# Patient Record
Sex: Male | Born: 1980 | Race: White | Hispanic: No | Marital: Single | Smoking: Current every day smoker
Health system: Southern US, Community
[De-identification: ages and names within clinical notes are randomized; demographics above are authoritative.]

## PROBLEM LIST (undated history)

## (undated) DIAGNOSIS — F329 Major depressive disorder, single episode, unspecified: Secondary | ICD-10-CM

## (undated) DIAGNOSIS — F99 Mental disorder, not otherwise specified: Secondary | ICD-10-CM

## (undated) DIAGNOSIS — Q688 Other specified congenital musculoskeletal deformities: Secondary | ICD-10-CM

## (undated) DIAGNOSIS — F419 Anxiety disorder, unspecified: Secondary | ICD-10-CM

## (undated) DIAGNOSIS — K219 Gastro-esophageal reflux disease without esophagitis: Secondary | ICD-10-CM

## (undated) DIAGNOSIS — G40409 Other generalized epilepsy and epileptic syndromes, not intractable, without status epilepticus: Secondary | ICD-10-CM

## (undated) DIAGNOSIS — J189 Pneumonia, unspecified organism: Secondary | ICD-10-CM

## (undated) DIAGNOSIS — F32A Depression, unspecified: Secondary | ICD-10-CM

## (undated) HISTORY — PX: OTHER SURGICAL HISTORY: SHX169

## (undated) HISTORY — PX: WISDOM TOOTH EXTRACTION: SHX21

---

## 2011-05-03 ENCOUNTER — Emergency Department (HOSPITAL_COMMUNITY)
Admission: EM | Admit: 2011-05-03 | Discharge: 2011-05-03 | Disposition: A | Discharge status: Home / Self Care | Payer: Self-pay | Attending: Emergency Medicine | Admitting: Emergency Medicine

## 2011-05-03 DIAGNOSIS — G40909 Epilepsy, unspecified, not intractable, without status epilepticus: Secondary | ICD-10-CM | POA: Insufficient documentation

## 2011-05-03 DIAGNOSIS — Z79899 Other long term (current) drug therapy: Secondary | ICD-10-CM | POA: Insufficient documentation

## 2011-05-03 DIAGNOSIS — Z760 Encounter for issue of repeat prescription: Secondary | ICD-10-CM | POA: Insufficient documentation

## 2011-05-03 DIAGNOSIS — Q899 Congenital malformation, unspecified: Secondary | ICD-10-CM | POA: Insufficient documentation

## 2011-06-23 ENCOUNTER — Emergency Department (HOSPITAL_COMMUNITY)
Admission: EM | Admit: 2011-06-23 | Discharge: 2011-06-23 | Disposition: A | Discharge status: Home / Self Care | Payer: Medicare Other | Attending: Emergency Medicine | Admitting: Emergency Medicine

## 2011-06-23 DIAGNOSIS — R569 Unspecified convulsions: Secondary | ICD-10-CM

## 2011-06-23 DIAGNOSIS — M6289 Other specified disorders of muscle: Secondary | ICD-10-CM | POA: Insufficient documentation

## 2011-06-23 DIAGNOSIS — Z993 Dependence on wheelchair: Secondary | ICD-10-CM | POA: Insufficient documentation

## 2011-06-23 DIAGNOSIS — G40909 Epilepsy, unspecified, not intractable, without status epilepticus: Secondary | ICD-10-CM | POA: Insufficient documentation

## 2011-06-23 HISTORY — DX: Other specified congenital musculoskeletal deformities: Q68.8

## 2011-06-23 LAB — VALPROIC ACID LEVEL: Valproic Acid Lvl: <10 ug/mL — ABNORMAL LOW (ref 50.0–100.0)

## 2011-06-23 MED ORDER — VALPROIC ACID 250 MG PO CAPS: ORAL_CAPSULE | ORAL | Status: DC

## 2011-06-23 MED ORDER — VALPROIC ACID 250 MG PO CAPS
250.0000 mg | ORAL_CAPSULE | Freq: Once | ORAL | Status: AC
  Administered 2011-06-23: 250 mg via ORAL
  Filled 2011-06-23: qty 1

## 2011-06-23 NOTE — ED Notes (Signed)
Pt went to [facility] for refill of valproic acid.  While there he had a witnessed gran mal seizure lasting approx 2 mins.  Pt was incontinent of urine.  Was post ictal per ems but now axox3 as has arrived.   Pt reports usually only has 1 seizure when he has them.

## 2011-06-23 NOTE — ED Notes (Signed)
Pt c/o tongue pain.  Did bite LT side of tongue during the seizure but no bleeding noted.  Pt also states his electric wheel chair remains at the [facility].  He took the bus to the urgent care.

## 2011-06-23 NOTE — ED Notes (Signed)
Helped pt get dressed and pt wanted me to tell the nurse that he needs to go straight home instead of getting wheelchair today.

## 2011-06-23 NOTE — ED Provider Notes (Signed)
History     CSN: [id number] Arrival date & time: 06/23/2011  2:13 PM   First MD Initiated Contact with Patient 06/23/11 1444    2:29PM  HPI Patient reports he's been out of valproic acid for one week. States he went to sleep tonight to urgent care to have a refill of his medication. While waiting, he had a grand mal seizure. Patient does not have a primary care physician or a neurologist in the [place] area. Would like a referral as well a prescription of valproic acid. Reports a history of seizure disorder. Denies any pain changes and typical seizure. Reports he did bite his tongue and is having tongue pain. Denies headache, nausea, dizziness, drowsiness, confusion. Patient is a 30 y.o. male presenting with seizures. The history is provided by the patient.  Seizures  This is a chronic problem. The current episode started 1 to 2 hours ago. The problem has been resolved. There was 1 seizure. The most recent episode lasted 30 to 120 seconds. Pertinent negatives include no sleepiness, no confusion, no headaches, no speech difficulty, no visual disturbance, no sore throat, no chest pain, no cough, no vomiting and no muscle weakness. Characteristics include rhythmic jerking, loss of consciousness and bit tongue. Characteristics do not include bladder incontinence. The episode was witnessed. There was no sensation of an aura present. The seizures did not continue in the ED. Possible causes include missed seizure meds. There has been no fever.    Past Medical History  Diagnosis Date  . Seizures   . Arthrogryposis     wheel chair bound    Past Surgical History  Procedure Date  . Knee tendonotomy     as a child    History reviewed. No pertinent family history.  History  Substance Use Topics  . Smoking status: Former Games developer  . Smokeless tobacco: Not on file  . Alcohol Use: Yes     occasionally      Review of Systems  Constitutional: Negative for fever and chills.  HENT: Negative  for sore throat, neck pain and neck stiffness.   Eyes: Negative for visual disturbance.  Respiratory: Negative for cough and shortness of breath.   Cardiovascular: Negative for chest pain.  Gastrointestinal: Negative for vomiting.  Genitourinary: Negative for bladder incontinence.  Musculoskeletal: Negative for back pain.  Neurological: Positive for seizures and loss of consciousness. Negative for speech difficulty, weakness and headaches.  Psychiatric/Behavioral: Negative for confusion.  All other systems reviewed and are negative.    Allergies  Amoxicillin and Morphine and related  Home Medications   Current Outpatient Rx  Name Route Sig Dispense Refill  . VALPROIC ACID 250 MG PO CAPS Oral Take 250-500 mg by mouth 2 (two) times daily. 1 capsule in the morning and 2 at night      BP 131/81  Pulse 78  Temp(Src) 97.6 F (36.4 C) (Oral)  Resp 16  Wt 106 lb (48.081 kg)  SpO2 99%  Physical Exam  Constitutional: He is oriented to person, place, and time. He appears well-developed and well-nourished.  HENT:  Head: Normocephalic and atraumatic.  Eyes: Conjunctivae are normal. Pupils are equal, round, and reactive to light.  Neck: Normal range of motion. Neck supple.  Cardiovascular: Normal rate, regular rhythm and normal heart sounds.   Pulmonary/Chest: Effort normal and breath sounds normal.  Abdominal: Soft. Bowel sounds are normal.  Neurological: He is alert and oriented to person, place, and time. He has normal strength. No cranial nerve deficit or sensory  deficit. Coordination normal. GCS eye subscore is 4. GCS verbal subscore is 5. GCS motor subscore is 6.       H/o Arthrogyposis: wheelchair bound  Skin: Skin is warm and dry. No rash noted. No erythema. No pallor.  Psychiatric: He has a normal mood and affect. His behavior is normal.    ED Course  Procedures   MDM   5:02 PM Valproic acid level is low. We'll write prescription for 30 days worth of valproic acid and  refer to neurology. Patient has not had seizure while in ED.      Thomasene Lot, [place] 06/23/11 [phone]

## 2011-06-23 NOTE — ED Notes (Signed)
Pt given discharge instructions and verbalizes understanding  

## 2011-06-23 NOTE — ED Notes (Signed)
ZOX:[id number]<AV> Expected date:06/23/11<BR> Expected time:<BR> Means of arrival:Ambulance<BR> Comments:<BR> M100 - seizure with hx.

## 2011-06-24 NOTE — ED Provider Notes (Signed)
Medical screening examination/treatment/procedure(s) were performed by non-physician practitioner and as supervising physician I was immediately available for consultation/collaboration.   Glynn Octave, MD 06/24/11 [phone]

## 2011-08-10 ENCOUNTER — Inpatient Hospital Stay (HOSPITAL_COMMUNITY)
Admission: EM | Admit: 2011-08-10 | Discharge: 2011-08-12 | DRG: 917 | Disposition: A | Discharge status: Home / Self Care | Payer: Medicare Other | Attending: Internal Medicine | Admitting: Internal Medicine

## 2011-08-10 ENCOUNTER — Encounter (HOSPITAL_COMMUNITY): Payer: Self-pay | Admitting: *Deleted

## 2011-08-10 ENCOUNTER — Emergency Department (HOSPITAL_COMMUNITY): Payer: Medicare Other

## 2011-08-10 ENCOUNTER — Other Ambulatory Visit: Payer: Self-pay

## 2011-08-10 DIAGNOSIS — F1210 Cannabis abuse, uncomplicated: Secondary | ICD-10-CM | POA: Diagnosis present

## 2011-08-10 DIAGNOSIS — F1910 Other psychoactive substance abuse, uncomplicated: Secondary | ICD-10-CM | POA: Diagnosis present

## 2011-08-10 DIAGNOSIS — E876 Hypokalemia: Secondary | ICD-10-CM | POA: Diagnosis present

## 2011-08-10 DIAGNOSIS — Y92009 Unspecified place in unspecified non-institutional (private) residence as the place of occurrence of the external cause: Secondary | ICD-10-CM

## 2011-08-10 DIAGNOSIS — D72829 Elevated white blood cell count, unspecified: Secondary | ICD-10-CM | POA: Diagnosis present

## 2011-08-10 DIAGNOSIS — G40909 Epilepsy, unspecified, not intractable, without status epilepticus: Secondary | ICD-10-CM | POA: Diagnosis present

## 2011-08-10 DIAGNOSIS — K922 Gastrointestinal hemorrhage, unspecified: Secondary | ICD-10-CM | POA: Diagnosis not present

## 2011-08-10 DIAGNOSIS — X58XXXA Exposure to other specified factors, initial encounter: Secondary | ICD-10-CM | POA: Diagnosis present

## 2011-08-10 DIAGNOSIS — T40904A Poisoning by unspecified psychodysleptics [hallucinogens], undetermined, initial encounter: Secondary | ICD-10-CM | POA: Diagnosis present

## 2011-08-10 DIAGNOSIS — G92 Toxic encephalopathy: Secondary | ICD-10-CM | POA: Diagnosis present

## 2011-08-10 DIAGNOSIS — G929 Unspecified toxic encephalopathy: Secondary | ICD-10-CM | POA: Diagnosis present

## 2011-08-10 DIAGNOSIS — T43502A Poisoning by unspecified antipsychotics and neuroleptics, intentional self-harm, initial encounter: Secondary | ICD-10-CM | POA: Diagnosis present

## 2011-08-10 DIAGNOSIS — T424X4A Poisoning by benzodiazepines, undetermined, initial encounter: Principal | ICD-10-CM | POA: Diagnosis present

## 2011-08-10 DIAGNOSIS — Z885 Allergy status to narcotic agent status: Secondary | ICD-10-CM

## 2011-08-10 DIAGNOSIS — J9600 Acute respiratory failure, unspecified whether with hypoxia or hypercapnia: Secondary | ICD-10-CM | POA: Diagnosis present

## 2011-08-10 DIAGNOSIS — I959 Hypotension, unspecified: Secondary | ICD-10-CM | POA: Diagnosis present

## 2011-08-10 DIAGNOSIS — Y929 Unspecified place or not applicable: Secondary | ICD-10-CM

## 2011-08-10 DIAGNOSIS — J9690 Respiratory failure, unspecified, unspecified whether with hypoxia or hypercapnia: Secondary | ICD-10-CM

## 2011-08-10 DIAGNOSIS — Z87891 Personal history of nicotine dependence: Secondary | ICD-10-CM

## 2011-08-10 DIAGNOSIS — Z993 Dependence on wheelchair: Secondary | ICD-10-CM

## 2011-08-10 DIAGNOSIS — T50901A Poisoning by unspecified drugs, medicaments and biological substances, accidental (unintentional), initial encounter: Secondary | ICD-10-CM

## 2011-08-10 DIAGNOSIS — I498 Other specified cardiac arrhythmias: Secondary | ICD-10-CM | POA: Diagnosis present

## 2011-08-10 DIAGNOSIS — Y999 Unspecified external cause status: Secondary | ICD-10-CM

## 2011-08-10 DIAGNOSIS — M6289 Other specified disorders of muscle: Secondary | ICD-10-CM | POA: Diagnosis present

## 2011-08-10 DIAGNOSIS — IMO0002 Reserved for concepts with insufficient information to code with codable children: Secondary | ICD-10-CM | POA: Diagnosis present

## 2011-08-10 LAB — RAPID URINE DRUG SCREEN, HOSP PERFORMED
Amphetamines: NOT DETECTED
Cocaine: NOT DETECTED
Opiates: POSITIVE — AB
Tetrahydrocannabinol: POSITIVE — AB

## 2011-08-10 LAB — COMPREHENSIVE METABOLIC PANEL
AST: 33 U/L (ref 0–37)
Albumin: 4.2 g/dL (ref 3.5–5.2)
Alkaline Phosphatase: 73 U/L (ref 39–117)
Chloride: 100 mEq/L (ref 96–112)
Potassium: 3 mEq/L — ABNORMAL LOW (ref 3.5–5.1)
Sodium: 138 mEq/L (ref 135–145)
Total Bilirubin: 0.3 mg/dL (ref 0.3–1.2)
Total Protein: 7.1 g/dL (ref 6.0–8.3)

## 2011-08-10 LAB — URINALYSIS, ROUTINE W REFLEX MICROSCOPIC
Glucose, UA: NEGATIVE mg/dL
Hgb urine dipstick: NEGATIVE
Leukocytes, UA: NEGATIVE
Protein, ur: NEGATIVE mg/dL
Specific Gravity, Urine: 1.026 (ref 1.005–1.030)
Urobilinogen, UA: 0.2 mg/dL (ref 0.0–1.0)

## 2011-08-10 LAB — BLOOD GAS, VENOUS
Acid-base deficit: 2.9 mmol/L — ABNORMAL HIGH (ref 0.0–2.0)
MECHVT: 310 mL
RATE: 15 resp/min
pCO2, Ven: 34.4 mmHg — ABNORMAL LOW (ref 45.0–50.0)
pH, Ven: 7.4 — ABNORMAL HIGH (ref 7.250–7.300)

## 2011-08-10 LAB — CBC
HCT: 43.5 % (ref 39.0–52.0)
HCT: 37.2 % — ABNORMAL LOW (ref 39.0–52.0)
Hemoglobin: 12.6 g/dL — ABNORMAL LOW (ref 13.0–17.0)
MCHC: 34.9 g/dL (ref 30.0–36.0)
Platelets: 267 10*3/uL (ref 150–400)
RBC: 3.81 MIL/uL — ABNORMAL LOW (ref 4.22–5.81)
RDW: 13 % (ref 11.5–15.5)
WBC: 13.3 10*3/uL — ABNORMAL HIGH (ref 4.0–10.5)
WBC: 18.6 10*3/uL — ABNORMAL HIGH (ref 4.0–10.5)

## 2011-08-10 LAB — BASIC METABOLIC PANEL
CO2: 16 mEq/L — ABNORMAL LOW (ref 19–32)
Calcium: 6.8 mg/dL — ABNORMAL LOW (ref 8.4–10.5)
GFR calc non Af Amer: >90 mL/min (ref >90)
Potassium: 4.3 mEq/L (ref 3.5–5.1)
Sodium: 140 mEq/L (ref 135–145)

## 2011-08-10 LAB — HEMOGLOBIN AND HEMATOCRIT, BLOOD: HCT: 37.6 % — ABNORMAL LOW (ref 39.0–52.0)

## 2011-08-10 LAB — CARDIAC PANEL(CRET KIN+CKTOT+MB+TROPI)
CK, MB: 7.9 ng/mL (ref 0.3–4.0)
Relative Index: 3.1 — ABNORMAL HIGH (ref 0.0–2.5)
Relative Index: 3.1 — ABNORMAL HIGH (ref 0.0–2.5)
Total CK: 257 U/L — ABNORMAL HIGH (ref 7–232)
Total CK: 345 U/L — ABNORMAL HIGH (ref 7–232)
Troponin I: <0.3 ng/mL (ref <0.30)

## 2011-08-10 LAB — GLUCOSE, CAPILLARY
Glucose-Capillary: 127 mg/dL — ABNORMAL HIGH (ref 70–99)
Glucose-Capillary: 138 mg/dL — ABNORMAL HIGH (ref 70–99)

## 2011-08-10 LAB — POCT I-STAT, CHEM 8
BUN: 10 mg/dL (ref 6–23)
Calcium, Ion: 1.1 mmol/L — ABNORMAL LOW (ref 1.12–1.32)
Chloride: 106 mEq/L (ref 96–112)
Glucose, Bld: 71 mg/dL (ref 70–99)

## 2011-08-10 LAB — PRO B NATRIURETIC PEPTIDE: Pro B Natriuretic peptide (BNP): 74.1 pg/mL (ref 0–125)

## 2011-08-10 LAB — LACTIC ACID, PLASMA: Lactic Acid, Venous: 0.4 mmol/L — ABNORMAL LOW (ref 0.5–2.2)

## 2011-08-10 LAB — PROCALCITONIN: Procalcitonin: 0.22 ng/mL

## 2011-08-10 LAB — MRSA PCR SCREENING: MRSA by PCR: NEGATIVE

## 2011-08-10 LAB — ACETAMINOPHEN LEVEL: Acetaminophen (Tylenol), Serum: <15 ug/mL (ref 10–30)

## 2011-08-10 MED ORDER — SUCCINYLCHOLINE CHLORIDE 20 MG/ML IJ SOLN
INTRAMUSCULAR | Status: AC | PRN
  Administered 2011-08-10: 100 mg via INTRAVENOUS

## 2011-08-10 MED ORDER — MIDAZOLAM HCL 5 MG/5ML IJ SOLN
INTRAMUSCULAR | Status: AC | PRN
  Administered 2011-08-10: 2 mg via INTRAVENOUS

## 2011-08-10 MED ORDER — CHLORHEXIDINE GLUCONATE 0.12 % MT SOLN
15.0000 mL | Freq: Two times a day (BID) | OROMUCOSAL | Status: DC
  Filled 2011-08-10 (×2): qty 15

## 2011-08-10 MED ORDER — DEXTROSE-NACL 5-0.45 % IV SOLN
INTRAVENOUS | Status: DC
  Administered 2011-08-10: 11:00:00 via INTRAVENOUS
  Administered 2011-08-11: 75 mL/h via INTRAVENOUS

## 2011-08-10 MED ORDER — NOREPINEPHRINE BITARTRATE 1 MG/ML IJ SOLN
2.0000 ug/min | INTRAVENOUS | Status: DC
  Administered 2011-08-10: 2 ug/min via INTRAVENOUS
  Filled 2011-08-10: qty 4

## 2011-08-10 MED ORDER — ETOMIDATE 2 MG/ML IV SOLN
INTRAVENOUS | Status: AC | PRN
  Administered 2011-08-10: 20 mg via INTRAVENOUS

## 2011-08-10 MED ORDER — DEXTROSE 50 % IV SOLN
25.0000 mL | Freq: Once | INTRAVENOUS | Status: AC
  Administered 2011-08-10: 25 mL via INTRAVENOUS

## 2011-08-10 MED ORDER — SODIUM CHLORIDE 0.9 % IV BOLUS (SEPSIS)
500.0000 mL | Freq: Once | INTRAVENOUS | Status: AC
  Administered 2011-08-10: 500 mL via INTRAVENOUS

## 2011-08-10 MED ORDER — ATROPINE SULFATE 1 MG/ML IJ SOLN
INTRAMUSCULAR | Status: AC | PRN
Start: 2011-08-10 — End: 2011-08-10
  Administered 2011-08-10: .5 mg via INTRAVENOUS

## 2011-08-10 MED ORDER — SODIUM CHLORIDE 0.9 % IV SOLN
INTRAVENOUS | Status: AC | PRN
  Administered 2011-08-10: 1000 mL/h via INTRAVENOUS

## 2011-08-10 MED ORDER — SODIUM CHLORIDE 0.9 % IV SOLN: INTRAVENOUS | Status: DC

## 2011-08-10 MED ORDER — VALPROIC ACID 250 MG/5ML PO SYRP
250.0000 mg | ORAL_SOLUTION | Freq: Every day | ORAL | Status: DC
  Filled 2011-08-10: qty 5

## 2011-08-10 MED ORDER — SODIUM CHLORIDE 0.9 % IV SOLN
8.0000 mg/h | INTRAVENOUS | Status: DC
  Administered 2011-08-10: 8 mg/h via INTRAVENOUS
  Filled 2011-08-10 (×2): qty 80

## 2011-08-10 MED ORDER — PROPOFOL 10 MG/ML IV EMUL
INTRAVENOUS | Status: AC
  Administered 2011-08-10: 20 ug/kg/min via INTRAVENOUS
  Filled 2011-08-10: qty 100

## 2011-08-10 MED ORDER — IOHEXOL 300 MG/ML  SOLN
100.0000 mL | Freq: Once | INTRAMUSCULAR | Status: AC | PRN
  Administered 2011-08-10: 100 mL via INTRAVENOUS

## 2011-08-10 MED ORDER — SODIUM CHLORIDE 0.9 % IV SOLN
750.0000 mL | INTRAVENOUS | Status: DC | PRN
  Administered 2011-08-10: 500 mL via INTRAVENOUS

## 2011-08-10 MED ORDER — POTASSIUM CHLORIDE 10 MEQ/100ML IV SOLN
10.0000 meq | INTRAVENOUS | Status: AC
  Administered 2011-08-10 (×4): 10 meq via INTRAVENOUS
  Filled 2011-08-10 (×4): qty 100

## 2011-08-10 MED ORDER — SODIUM CHLORIDE 0.9 % IV SOLN
1.0000 mg/h | INTRAVENOUS | Status: DC
  Administered 2011-08-10: 1 mg/h via INTRAVENOUS
  Filled 2011-08-10: qty 10

## 2011-08-10 MED ORDER — FENTANYL CITRATE 0.05 MG/ML IJ SOLN
INTRAMUSCULAR | Status: AC | PRN
  Administered 2011-08-10: 100 ug via INTRAVENOUS

## 2011-08-10 MED ORDER — SODIUM CHLORIDE 0.9 % IV SOLN
80.0000 mg | Freq: Once | INTRAVENOUS | Status: AC
  Administered 2011-08-10: 80 mg via INTRAVENOUS
  Filled 2011-08-10: qty 80

## 2011-08-10 MED ORDER — SODIUM CHLORIDE 0.9 % IV SOLN
10.0000 ug/h | INTRAVENOUS | Status: DC
  Administered 2011-08-10: 10 ug/h via INTRAVENOUS
  Filled 2011-08-10: qty 50

## 2011-08-10 MED ORDER — VALPROIC ACID 250 MG/5ML PO SYRP
500.0000 mg | ORAL_SOLUTION | Freq: Every day | ORAL | Status: DC
  Filled 2011-08-10 (×2): qty 10

## 2011-08-10 MED ORDER — VALPROIC ACID 250 MG/5ML PO SYRP
250.0000 mg | ORAL_SOLUTION | Freq: Two times a day (BID) | ORAL | Status: DC
  Administered 2011-08-10: 250 mg
  Filled 2011-08-10 (×2): qty 5

## 2011-08-10 MED ORDER — DEXTROSE 50 % IV SOLN
INTRAVENOUS | Status: AC
  Administered 2011-08-10: 25 mL via INTRAVENOUS
  Filled 2011-08-10: qty 50

## 2011-08-10 MED ORDER — MIDAZOLAM HCL 2 MG/2ML IJ SOLN
INTRAMUSCULAR | Status: AC
  Filled 2011-08-10: qty 2

## 2011-08-10 MED ORDER — VALPROIC ACID 250 MG/5ML PO SYRP
500.0000 mg | ORAL_SOLUTION | Freq: Every day | ORAL | Status: DC
  Administered 2011-08-10: 500 mg via ORAL
  Filled 2011-08-10 (×2): qty 10

## 2011-08-10 MED ORDER — PROPOFOL 10 MG/ML IV EMUL
5.0000 ug/kg/min | INTRAVENOUS | Status: DC
  Administered 2011-08-10: 20 ug/kg/min via INTRAVENOUS

## 2011-08-10 MED ORDER — POTASSIUM CHLORIDE 20 MEQ/15ML (10%) PO LIQD
40.0000 meq | ORAL | Status: AC
  Administered 2011-08-10 (×2): 40 meq
  Filled 2011-08-10 (×2): qty 30

## 2011-08-10 MED ORDER — CHLORHEXIDINE GLUCONATE 0.12 % MT SOLN
15.0000 mL | Freq: Two times a day (BID) | OROMUCOSAL | Status: DC
  Administered 2011-08-10: 15 mL via OROMUCOSAL
  Filled 2011-08-10: qty 15

## 2011-08-10 MED ORDER — PHENOL 1.4 % MT LIQD
2.0000 | OROMUCOSAL | Status: DC | PRN
  Administered 2011-08-10 – 2011-08-11 (×3): 2 via OROMUCOSAL
  Filled 2011-08-10: qty 177

## 2011-08-10 MED ORDER — VECURONIUM BROMIDE 10 MG IV SOLR: 6.0000 mg | Freq: Once | INTRAVENOUS | Status: AC

## 2011-08-10 MED ORDER — FENTANYL CITRATE 0.05 MG/ML IJ SOLN
INTRAMUSCULAR | Status: AC
  Filled 2011-08-10: qty 2

## 2011-08-10 MED ORDER — HEPARIN SODIUM (PORCINE) 5000 UNIT/ML IJ SOLN
5000.0000 [IU] | Freq: Three times a day (TID) | INTRAMUSCULAR | Status: DC
  Filled 2011-08-10 (×4): qty 1

## 2011-08-10 MED ORDER — PANTOPRAZOLE SODIUM 40 MG IV SOLR
40.0000 mg | Freq: Two times a day (BID) | INTRAVENOUS | Status: DC
  Administered 2011-08-10: 40 mg via INTRAVENOUS
  Filled 2011-08-10 (×3): qty 40

## 2011-08-10 MED ORDER — BIOTENE DRY MOUTH MT LIQD
1.0000 "application " | Freq: Four times a day (QID) | OROMUCOSAL | Status: DC
  Administered 2011-08-10 – 2011-08-12 (×6): 15 mL via OROMUCOSAL

## 2011-08-10 MED ORDER — VECURONIUM BROMIDE 10 MG IV SOLR
INTRAVENOUS | Status: AC | PRN
  Administered 2011-08-10: 6 mg via INTRAVENOUS

## 2011-08-10 MED FILL — Medication: Qty: 1 | Status: AC

## 2011-08-10 NOTE — H&P (Signed)
Name: [name] MRN: [id number] DOB: 1981-02-15    LOS: 0  PCCM ADMISSION NOTE  History of Present Illness: 31 yo brought to [facility] with slurred speech reporting smoking cannabis.  Became unresponsive and intubated for airway protection.  EMS did find a bottle of Clonazepam 1 mg with 60 tablets gone, prescribed yesterday.   Lines / Drains: 1/17  ETT 1/17  OGT 1/17  R IJ TLC  Cultures: None  Antibiotics: None  Tests / Events: 1/17  Head CT>>>  The patient is sedated, intubated and unable to provide history, which was obtained for available medical records.    Past Medical History  Diagnosis Date  . Seizures   . Arthrogryposis     wheel chair bound   Past Surgical History  Procedure Date  . Knee tendonotomy     as a child   Prior to Admission medications   Not on File   Allergies Allergies  Allergen Reactions  . Amoxicillin Other (See Comments)    Unsure of rxn  . Morphine And Related Hives   Family History History reviewed. No pertinent family history.  Social History  reports that he has quit smoking. He does not have any smokeless tobacco history on file. He reports that he drinks alcohol. He reports that he uses illicit drugs (Marijuana).  Review Of Systems  11 points review of systems is negative with an exception of listed in HPI.  Vital Signs: Temp:  [93.4 F (34.1 C)-95 F (35 C)] 95 F (35 C) (01/17 0200) Pulse Rate:  [50-103] 66  (01/17 0200) Resp:  [8-17] 17  (01/17 0200) BP: (102-151)/(53-105) 112/66 mmHg (01/17 0200) SpO2:  [100 %] 100 % (01/17 0200) FiO2 (%):  [100 %] 100 % (01/17 0125) Weight:  [47.628 kg (105 lb)] 47.628 kg (105 lb) (01/17 0020)    Physical Examination: General:  Mechanically ventilated, synchronous Neuro:  Sedated, arousable, nonfocal   HEENT:  PERRL, ETT / OGT in place, bloody gastric secretions Neck:  No  JVD Cardiovascular:  RRR, no murmurs Lungs:  CTAB, R lateral chest abrasion / ecchymosis  Abdomen:  Soft,  nontender, bowel sounds present Musculoskeletal:  Upper / lower extremities deformities Skin:  No rash  Ventilator settings: Vent Mode:  [-] PRVC FiO2 (%):  [100 %] 100 % Set Rate:  [15 bmp] 15 bmp Vt Set:  [310 mL] 310 mL PEEP:  [5 cmH20] 5 cmH20 Plateau Pressure:  [7 cmH20] 7 cmH20  Labs and Imaging:  Reviewed.  Please refer to the Assessment and Plan section for relevant results.  Assessment and Plan:  Encephalopathy secondary to polysubstance abuse.  Tox screen positive for benzodiazepines, opioids and cannabinoids. History of seizures, but no active seizures.  Sub therapeutic valproic acid level. Question noncompliance.  -->  Hold preadmission medications -->  Restart Depakote in AM -->  Propofol for synchrony -->  WUA -->  Head CT  Acute respiratory failure secondary to inability to protect airways. -->  full mechanical support -->  goal pH > 7.30, goal SpO2 > 92 -->  f/u ABG -->  f/u CXR -->  daily SBT  Acute upper gastrointestinal hemorrhage, etiology is not clear, hemodynamically stable. -->  Protonix gtt -->  NGT to Sx -->  H&H q6h -->  May need GI evaluation  Hypokalemia  Lab 08/10/11 0208 08/10/11 0140  K 3.2* 3.0*   -->  Replace  Best practices / Disposition -->  ICU status under PCCM -->  full code -->  SCDs  for DVT Px -->  GI Px is not indicated (on Protonix gtt) -->  ventilator bundle -->  NPO -->  family is not available   The patient is critically ill with multiple organ systems failure and requires high complexity decision making for assessment and support, frequent evaluation and titration of therapies, application of advanced monitoring technologies and extensive interpretation of multiple databases. Critical Care Time devoted to patient care services described in this note is 45 minutes.  Orlean Bradford, M.D. Pulmonary and Critical Care Medicine [facility] Cell: [phone] Pager: [phone]  08/10/2011, 2:36 AM

## 2011-08-10 NOTE — ED Notes (Signed)
WUJ:[id number]<JY> Expected date:<BR> Expected time:<BR> Means of arrival:<BR> Comments:<BR> EMS/Overdose/altered LOC

## 2011-08-10 NOTE — Progress Notes (Signed)
eLink Physician-Brief Progress Note Patient Name: Jamespaul Secrist DOB: 01-03-1981 MRN: [id number]  Date of Service  08/10/2011   HPI/Events of Note   Best practice  eICU Interventions  subq heparin for DVT propy   Intervention Category Intermediate Interventions: Best-practice therapies (e.g. DVT, beta blocker, etc.)  DETERDING,ELIZABETH 08/10/2011, 6:47 AM

## 2011-08-10 NOTE — ED Notes (Signed)
Per EMS - pt's neighbor called 911 concerned about pt. Pt had a bottle of klonopin filled yesterday w/ 60 tabs of 1mg . On arrival w/ unresponsive to painful stimuli - pupils reactive. Pt also reported to have smoked marijuana as well.

## 2011-08-10 NOTE — ED Notes (Signed)
Call placed to mom regarding pt's status. Mom did not answer. Left a message instruction mom to call this RN at her earliest convenience.

## 2011-08-10 NOTE — Progress Notes (Signed)
Name: [name] MRN: [id number] DOB: [birth year]    LOS: 0  PCCM progress note  History of Present Illness: 31 yo brought to [facility] with slurred speech reporting smoking cannabis.  Became unresponsive and intubated for airway protection.  EMS did find a bottle of Clonazepam 1 mg with 60 tablets gone, prescribed yesterday.   Lines / Drains: 1/17  ETT 1/17  OGT 1/17  R IJ TLC  Cultures: None  Antibiotics: None  Tests / Events: 1/17  Head CT and CT abd/pelvis: negative for acute findings.     Vital Signs: Temp:  [93 F (33.9 C)-96.4 F (35.8 C)] 95.5 F (35.3 C) (01/17 0745) Pulse Rate:  [50-103] 87  (01/17 0745) Resp:  [8-24] 18  (01/17 0745) BP: (78-151)/(32-105) 81/32 mmHg (01/17 0745) SpO2:  [100 %] 100 % (01/17 0745) FiO2 (%):  [50 %-100 %] 50 % (01/17 0730) Weight:  [47.628 kg (105 lb)-50 kg (110 lb 3.7 oz)] 50 kg (110 lb 3.7 oz) (01/17 0435)      . sodium chloride 1,000 mL/hr (08/10/11 0017)  . sodium chloride    . norepinephrine (LEVOPHED) Adult infusion    . propofol Stopped (08/10/11 0630)  . DISCONTD: fentaNYL infusion INTRAVENOUS Stopped (08/10/11 0230)  . DISCONTD: midazolam (VERSED) infusion Stopped (08/10/11 0230)  . DISCONTD: pantoprozole (PROTONIX) infusion 8 mg/hr (08/10/11 0349)    Intake/Output Summary (Last 24 hours) at 08/10/11 0904 Last data filed at 08/10/11 0700  Gross per 24 hour  Intake 512.15 ml  Output    700 ml  Net -187.85 ml    Physical Examination: General:  Mechanically ventilated, synchronous Neuro:  Sedated, arousable, nonfocal   HEENT:  PERRL, ETT / OGT in place, bloody gastric secretions Neck:  No  JVD Cardiovascular:  RRR, no murmurs Lungs:  CTAB, R lateral chest abrasion / ecchymosis  Abdomen:  Soft, nontender, bowel sounds present Musculoskeletal:  Upper / lower extremities deformities Skin:  No rash  Ventilator settings: Vent Mode:  [-] PRVC FiO2 (%):  [50 %-100 %] 50 % Set Rate:  [15 bmp] 15 bmp Vt Set:   [310 mL] 310 mL PEEP:  [5 cmH20] 5 cmH20 Plateau Pressure:  [7 cmH20-8 cmH20] 8 cmH20  Labs and Imaging:   No results found for this basename: CKTOTAL:3,CKMB:3,TROPONINI:3 in the last 168 hours No results found for this basename: PROBNP:3 in the last 168 hours   Lab 08/10/11 0208 08/10/11 0140  NA 142 138  K 3.2* 3.0*  CL 106 100  CO2 -- 25  BUN 10 10  CREATININE 0.60 0.40*  GLUCOSE 71 65*    Lab 08/10/11 0555 08/10/11 0208 08/10/11 0140  HGB 13.0 14.6 15.2  HCT 38.1* 43.0 43.5  WBC -- -- 13.3*  PLT -- -- 267   ABG    Component Value Date/Time   HCO3 21.5 08/10/2011 0225   TCO2 19.2 08/10/2011 0225   ACIDBASEDEF 2.9* 08/10/2011 0225   O2SAT 88.8 08/10/2011 0225   Pcxr: clear, no infiltrate, Support lines/tubes in good position.   Assessment and Plan:  Encephalopathy secondary to polysubstance abuse.  Tox screen positive for benzodiazepines, opioids and cannabinoids. History of seizures, but no active seizures.  Sub therapeutic valproic acid level. Question noncompliance. CT head unremarkable.  Plan: -->  cont Depakote (home med) -->  Propofol for synchrony PRN -->need psych and social work eval post-extubation -->  WUA  Hypotension/shock (unclear etiology). Suspect multi-factorial: residual sedation effect, hypovolemia, need to eval for hgb drift, trop I bump  and sepsis as well.   Lab 08/10/11 0200 08/10/11 0140  PROCALCITON -- --  WBC -- 13.3*  LATICACIDVEN 0.9 --  plan: -transduce CVP, check lactate, PCT, CEs, repeat stat Hgb (goal CVP 8-12) -add pressors for map >65  Acute respiratory failure secondary to inability to protect airways. -->  full mechanical support -->  goal pH > 7.30, goal SpO2 > 92 -->  f/u ABG -->  f/u CXR -->  daily SBT, after MS improved and hemodynamically stable.   Acute upper gastrointestinal hemorrhage, etiology is not clear. Suspect NGT trauma. Hgb trending down but no evidence of current bleeding and suspect hgb drift is d/t volume  resuscitation efforts.   Lab 08/10/11 0555 08/10/11 0208 08/10/11 0140  HGB 13.0 14.6 15.2  HCT 38.1* 43.0 43.5  plan: -->  Protonix q 12 -->  NGT clamp -->  H&H q6h -->  SCDs   Hypokalemia  Lab 08/10/11 0208 08/10/11 0140  K 3.2* 3.0*   -->  Replace & recheck  Best practices / Disposition -->  ICU status under PCCM -->  full code -->  SCDs for DVT Px -->  GI Px is not indicated (on Protonix gtt) -->  ventilator bundle -->  NPO -->  family is not available   Reviewed above, examined pt, and agree with assessment/plan.  Hemodynamics improved after volume resuscitation and successfully extubated.  Will need to have psych evaluate.  Critical care time 35 minutes.  Coralyn Helling, MD 08/10/2011, 3:42 PM Pager:  [phone]

## 2011-08-10 NOTE — ED Notes (Signed)
MD and resp at bedside

## 2011-08-10 NOTE — Progress Notes (Signed)
CARE MANAGEMENT NOTE 08/10/2011  Patient:  [name], [name]   Account Number:  [id number]  Date Initiated:  08/10/2011  Documentation initiated by:  DAVIS,RHONDA  Subjective/Objective Assessment:   pt with multi system problems , took overdaose of medications, and cannbis     Action/Plan:   lives at home   Anticipated DC Date:  08/13/2011   Anticipated DC Plan:  HOME/SELF CARE  In-house referral  NA      DC Planning Services  NA      [facility] Choice  NA   Choice offered to / List presented to:  NA      DME agency  NA     HH arranged  NA      HH agency  NA   Status of service:  In process, will continue to follow Medicare Important Message given?  NA - LOS <3 / Initial given by admissions (If response is "NO", the following Medicare IM given date fields will be blank) Date Medicare IM given:   Date Additional Medicare IM given:    Discharge Disposition:    Per UR Regulation:  Reviewed for med. necessity/level of care/duration of stay  Comments:  [id number]/Rhonda DAvis,Rn,BSn,CCM

## 2011-08-10 NOTE — Progress Notes (Signed)
CRITICAL VALUE ALERT  Critical value received:  MB 7.9 & Glucose 44  Date of notification:  08/10/2011   Time of notification:  1045  Critical value read back:yes  Nurse who received alert:  Chapman Fitch   MD notified (1st page):  Anders Simmonds, NP  Time of first page:  1048  MD notified (2nd page):  Time of second page:  Responding MD:  Anders Simmonds, NP  Time MD responded:  1048  New orders received

## 2011-08-10 NOTE — ED Notes (Signed)
Due to poor peripheral and femoral pulses and per Dr. Dierdre Highman, ABG is to be held at this time.

## 2011-08-10 NOTE — ED Provider Notes (Signed)
History     CSN: [id number]  Arrival date & time 08/10/11  0020   First MD Initiated Contact with Patient 08/10/11 0045      Chief Complaint  Patient presents with  . Drug Overdose    (Consider location/radiation/quality/duration/timing/severity/associated sxs/prior treatment) The history is provided by the EMS personnel.   level V caveat applies. Patient presents unresponsive GCS of 3. Per EMS, neighbor called 911 after patient was not acting normal. On EMS arrival patient reportedly had slurred speech and admitted to smoking marijuana and denied any other ingestions. Blood sugar within normal range and after a few minutes became completely unresponsive and was brought in by EMS. The medics did find a bottle of clonazepam 1 mg with 60 tablets gone, prescribed yesterday. Patient was also noted to have abrasion and ecchymosis to his right flank.  Past Medical History  Diagnosis Date  . Seizures   . Arthrogryposis     wheel chair bound    Past Surgical History  Procedure Date  . Knee tendonotomy     as a child    History reviewed. No pertinent family history.  History  Substance Use Topics  . Smoking status: Former Games developer  . Smokeless tobacco: Not on file  . Alcohol Use: Yes     occasionally      Review of Systems  Unable to perform ROS  level V caveat applies as above  Allergies  Amoxicillin and Morphine and related  Home Medications   Current Outpatient Rx  Name Route Sig Dispense Refill  . VALPROIC ACID 250 MG PO CAPS Oral Take 250-500 mg by mouth 2 (two) times daily. 1 capsule in the morning and 2 at night    . VALPROIC ACID 250 MG PO CAPS  1 Tab PO q morning, Then 2 tabs PO qhs 90 capsule 0    BP 102/53  Pulse 50  Temp(Src) 93.4 F (34.1 C) (Core (Comment))  Resp 8  Ht 4\' 9"  (1.448 m)  Wt 105 lb (47.628 kg)  BMI 22.72 kg/m2  SpO2 100%  Physical Exam  Constitutional: He appears well-developed and well-nourished.  HENT:  Head: Atraumatic.    Nose: Nose normal.       Oral pharynx is clear  Eyes: Pupils are equal, round, and reactive to light. Right eye exhibits no discharge. Left eye exhibits no discharge. No scleral icterus.  Neck: No tracheal deviation present.  Cardiovascular: Regular rhythm.        Week intact peripheral pulses bradycardic  Pulmonary/Chest: No stridor.       Decreased and shallow breath sounds bilaterally  Abdominal: Soft. He exhibits no distension.       Area of ecchymosis to right flank no crepitus  Genitourinary: Penis normal.  Musculoskeletal:       No spontaneous movements. Chronic deformities to all 4 extremities  Neurological:       GCS of 3. Pupils equal and reactive to light. No gag reflex. No spontaneous movement.  Skin: Skin is warm and dry.    ED Course  INTUBATION Date/Time: 08/10/2011 12:51 AM Performed by: Sunnie Nielsen Authorized by: Sunnie Nielsen Consent: The procedure was performed in an emergent situation. Required items: required blood products, implants, devices, and special equipment available Patient identity confirmed: arm band Time out: Immediately prior to procedure a "time out" was called to verify the correct patient, procedure, equipment, support staff and site/side marked as required. Indications: respiratory failure and airway protection Intubation method: direct Patient status: paralyzed (RSI)  Preoxygenation: nonrebreather mask and BVM Sedatives: etomidate Paralytic: succinylcholine Laryngoscope size: Mac 4 Tube size: 7.5 mm Tube type: cuffed Number of attempts: 1 Cricoid pressure: yes Cords visualized: yes Post-procedure assessment: chest rise and CO2 detector Breath sounds: equal Cuff inflated: yes ETT to teeth: 23 cm Tube secured with: ETT holder Chest x-ray interpreted by me. Comments: Huston Foley after intubation and HR 40s for 4-5 minutes and was given atropine 05.mg that improved HR  OG placement Date/Time: 08/10/2011 12:55 AM Performed by: Sunnie Nielsen Authorized by: Sunnie Nielsen Consent: The procedure was performed in an emergent situation. Patient identity confirmed: arm band Comments: Suction of gastric contents and appreciated gastric sounds after placement. CXR obtained   CRITICAL CARE Performed by: Sunnie Nielsen   Total critical care time: 70  Critical care time was exclusive of separately billable procedures and treating other patients.  Critical care was necessary to treat or prevent imminent or life-threatening deterioration.  Critical care was time spent personally by me on the following activities: development of treatment plan with patient and/or surrogate as well as nursing, discussions with consultants, evaluation of patient's response to treatment, examination of patient, obtaining history from patient or surrogate, ordering and performing treatments and interventions, ordering and review of laboratory studies, ordering and review of radiographic studies, pulse oximetry and re-evaluation of patient's condition. Vent management, sedation medications, critical care consultation for admit.    Results for orders placed during the hospital encounter of 08/10/11  CBC      Component Value Range   WBC 13.3 (*) 4.0 - 10.5 (K/uL)   RBC 4.54  4.22 - 5.81 (MIL/uL)   Hemoglobin 15.2  13.0 - 17.0 (g/dL)   HCT 69.6  29.5 - 28.4 (%)   MCV 95.8  78.0 - 100.0 (fL)   MCH 33.5  26.0 - 34.0 (pg)   MCHC 34.9  30.0 - 36.0 (g/dL)   RDW 13.2  44.0 - 10.2 (%)   Platelets 267  150 - 400 (K/uL)  COMPREHENSIVE METABOLIC PANEL      Component Value Range   Sodium 138  135 - 145 (mEq/L)   Potassium 3.0 (*) 3.5 - 5.1 (mEq/L)   Chloride 100  96 - 112 (mEq/L)   CO2 25  19 - 32 (mEq/L)   Glucose, Bld 65 (*) 70 - 99 (mg/dL)   BUN 10  6 - 23 (mg/dL)   Creatinine, Ser 7.25 (*) 0.50 - 1.35 (mg/dL)   Calcium 9.4  8.4 - 36.6 (mg/dL)   Total Protein 7.1  6.0 - 8.3 (g/dL)   Albumin 4.2  3.5 - 5.2 (g/dL)   AST 33  0 - 37 (U/L)   ALT 27  0 - 53  (U/L)   Alkaline Phosphatase 73  39 - 117 (U/L)   Total Bilirubin 0.3  0.3 - 1.2 (mg/dL)   GFR calc non Af Amer >90  >90 (mL/min)   GFR calc Af Amer >90  >90 (mL/min)  ACETAMINOPHEN LEVEL      Component Value Range   Acetaminophen (Tylenol), Serum <15.0  10 - 30 (ug/mL)  SALICYLATE LEVEL      Component Value Range   Salicylate Lvl <2.0 (*) 2.8 - 20.0 (mg/dL)  LACTIC ACID, PLASMA      Component Value Range   Lactic Acid, Venous 0.9  0.5 - 2.2 (mmol/L)  URINALYSIS, ROUTINE W REFLEX MICROSCOPIC      Component Value Range   Color, Urine YELLOW  YELLOW    APPearance CLEAR  CLEAR  Specific Gravity, Urine 1.026  1.005 - 1.030    pH 6.0  5.0 - 8.0    Glucose, UA NEGATIVE  NEGATIVE (mg/dL)   Hgb urine dipstick NEGATIVE  NEGATIVE    Bilirubin Urine NEGATIVE  NEGATIVE    Ketones, ur >80 (*) NEGATIVE (mg/dL)   Protein, ur NEGATIVE  NEGATIVE (mg/dL)   Urobilinogen, UA 0.2  0.0 - 1.0 (mg/dL)   Nitrite NEGATIVE  NEGATIVE    Leukocytes, UA NEGATIVE  NEGATIVE   URINE RAPID DRUG SCREEN (HOSP PERFORMED)      Component Value Range   Opiates POSITIVE (*) NONE DETECTED    Cocaine NONE DETECTED  NONE DETECTED    Benzodiazepines POSITIVE (*) NONE DETECTED    Amphetamines NONE DETECTED  NONE DETECTED    Tetrahydrocannabinol POSITIVE (*) NONE DETECTED    Barbiturates NONE DETECTED  NONE DETECTED   POCT I-STAT, CHEM 8      Component Value Range   Sodium 142  135 - 145 (mEq/L)   Potassium 3.2 (*) 3.5 - 5.1 (mEq/L)   Chloride 106  96 - 112 (mEq/L)   BUN 10  6 - 23 (mg/dL)   Creatinine, Ser 9.60  0.50 - 1.35 (mg/dL)   Glucose, Bld 71  70 - 99 (mg/dL)   Calcium, Ion 4.54 (*) 1.12 - 1.32 (mmol/L)   TCO2 23  0 - 100 (mmol/L)   Hemoglobin 14.6  13.0 - 17.0 (g/dL)   HCT 09.8  11.9 - 14.7 (%)   Dg Chest Portable 1 View  08/10/2011  *RADIOLOGY REPORT*  Clinical Data: Central line placement  PORTABLE CHEST - 1 VIEW  Comparison: 08/10/2011  Findings: Retraction of right IJ central line to the SVC RA  junction region.  Endotracheal tube and NG tube stable position. Normal heart size and vascularity.  Negative for focal airspace process or edema.  No effusion or pneumothorax.  IMPRESSION: Retracted right IJ central line now at the SVC/RA junction. Otherwise stable portable chest exam.  Original Report Authenticated By: Judie Petit. Ruel Favors, M.D.   Dg Chest Portable 1 View  08/10/2011  *RADIOLOGY REPORT*  Clinical Data: Endotracheal tube and central line placement; overdose, with altered mental status.  PORTABLE CHEST - 1 VIEW  Comparison: None.  Findings: The patient's endotracheal tube is seen ending 3-4 cm above the carina.  A right IJ line is noted extending through the right atrium and ending within the intrahepatic IVC.  This should be retracted at least 10-11 cm.  An enteric tube is noted ending at the fundus of the stomach.  Mild vascular congestion is noted; the lungs remain grossly clear. No pleural effusion or pneumothorax is seen.  The cardiomediastinal silhouette is borderline normal in size.  No acute osseous abnormalities are identified.  The stomach is partially filled with air.  IMPRESSION:  1.  Endotracheal tube seen ending 3-4 cm above the carina. 2.  Right IJ line noted ending within the intrahepatic IVC; this should be retracted at least 10-11 cm. 3.  Mild vascular congestion; lungs remain clear.  Findings were discussed with Dr. Sunnie Nielsen at 01:26 a.m. on 08/10/2011.  Original Report Authenticated By: Tonia Ghent, M.D.   Chest x-ray reviewed as above and central line retracted 10 cm. Repeat x-ray demonstrates lining in place.  Attempted to contact family multiple times.  Vital signs normalized after atropine given status post intubation.  MDM   Respiratory failure. Suspect overdose. Required intubation and ICU admit.        Arlys John  Dierdre Highman, MD 08/10/11 [phone]

## 2011-08-10 NOTE — ED Notes (Signed)
Critical Care Dr. Marton Redwood at bedside

## 2011-08-10 NOTE — Progress Notes (Signed)
Pt extubated to 2L [place] - no distress, no stridor, productive strong cough, positive bbs - RT will continue to monitor.  HR 100, Sat 100%, BP 135/81

## 2011-08-11 ENCOUNTER — Inpatient Hospital Stay (HOSPITAL_COMMUNITY): Payer: Medicare Other

## 2011-08-11 DIAGNOSIS — T40904A Poisoning by unspecified psychodysleptics [hallucinogens], undetermined, initial encounter: Secondary | ICD-10-CM

## 2011-08-11 DIAGNOSIS — Y92009 Unspecified place in unspecified non-institutional (private) residence as the place of occurrence of the external cause: Secondary | ICD-10-CM

## 2011-08-11 DIAGNOSIS — T424X4A Poisoning by benzodiazepines, undetermined, initial encounter: Principal | ICD-10-CM

## 2011-08-11 DIAGNOSIS — T438X2A Poisoning by other psychotropic drugs, intentional self-harm, initial encounter: Secondary | ICD-10-CM

## 2011-08-11 LAB — CBC
HCT: 34.7 % — ABNORMAL LOW (ref 39.0–52.0)
Hemoglobin: 12 g/dL — ABNORMAL LOW (ref 13.0–17.0)
MCH: 33 pg (ref 26.0–34.0)
MCHC: 34.6 g/dL (ref 30.0–36.0)
RDW: 13.1 % (ref 11.5–15.5)

## 2011-08-11 LAB — BASIC METABOLIC PANEL
BUN: <3 mg/dL — ABNORMAL LOW (ref 6–23)
Calcium: 8.3 mg/dL — ABNORMAL LOW (ref 8.4–10.5)
Creatinine, Ser: 0.3 mg/dL — ABNORMAL LOW (ref 0.50–1.35)
GFR calc non Af Amer: >90 mL/min (ref >90)
Glucose, Bld: 115 mg/dL — ABNORMAL HIGH (ref 70–99)

## 2011-08-11 LAB — GLUCOSE, CAPILLARY
Glucose-Capillary: 110 mg/dL — ABNORMAL HIGH (ref 70–99)
Glucose-Capillary: 115 mg/dL — ABNORMAL HIGH (ref 70–99)

## 2011-08-11 MED ORDER — DIVALPROEX SODIUM 500 MG PO DR TAB
500.0000 mg | DELAYED_RELEASE_TABLET | Freq: Every day | ORAL | Status: DC
  Administered 2011-08-11: 500 mg via ORAL
  Filled 2011-08-11 (×3): qty 1

## 2011-08-11 MED ORDER — ACETAMINOPHEN 325 MG PO TABS
650.0000 mg | ORAL_TABLET | Freq: Four times a day (QID) | ORAL | Status: DC | PRN
  Administered 2011-08-11: 650 mg via ORAL
  Filled 2011-08-11: qty 2

## 2011-08-11 MED ORDER — DIVALPROEX SODIUM 250 MG PO DR TAB
250.0000 mg | DELAYED_RELEASE_TABLET | Freq: Every day | ORAL | Status: DC
  Administered 2011-08-11 – 2011-08-12 (×2): 250 mg via ORAL
  Filled 2011-08-11 (×2): qty 1

## 2011-08-11 MED ORDER — SODIUM CHLORIDE 0.9 % IV SOLN
INTRAVENOUS | Status: DC
  Administered 2011-08-11: 12:00:00 via INTRAVENOUS
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
## 2011-08-11 NOTE — Progress Notes (Signed)
Name: [name] MRN: [id number] DOB: 03/13/1981    LOS: 1  PCCM progress note  History of Present Illness: 31 yo brought to [facility] with slurred speech reporting smoking cannabis.  Became unresponsive and intubated for airway protection.  EMS did find a bottle of Clonazepam 1 mg with 60 tablets gone, prescribed yesterday.   Lines / Drains: 1/17  ETT>>1/17 1/17  OGT>>1/17 1/17  R IJ TLC>>1/17  Cultures: None  Antibiotics: None  Tests / Events: 1/17  Head CT and CT abd/pelvis: negative for acute findings.   Subjective: C/o mild headache.  Denies chest/abd pain, or dyspnea.  Throat soreness improved.  Vital Signs: Temp:  [97.3 F (36.3 C)-100 F (37.8 C)] 97.8 F (36.6 C) (01/18 0800) Pulse Rate:  [67-105] 89  (01/18 0700) Resp:  [11-27] 18  (01/18 0700) BP: (89-144)/(36-81) 117/75 mmHg (01/18 0700) SpO2:  [95 %-100 %] 95 % (01/18 0700) FiO2 (%):  [39.7 %-40.4 %] 40 % (01/17 1400) Weight:  [113 lb 15.7 oz (51.7 kg)] 113 lb 15.7 oz (51.7 kg) (01/18 0345) CVP:  [7 mmHg-9 mmHg] 9 mmHg    . dextrose 5 % and 0.45% NaCl 75 mL/hr (08/11/11 0332)  . DISCONTD: sodium chloride    . DISCONTD: norepinephrine (LEVOPHED) Adult infusion Stopped (08/10/11 1152)  . DISCONTD: propofol Stopped (08/10/11 0630)    Intake/Output Summary (Last 24 hours) at 08/11/11 0920 Last data filed at 08/11/11 0600  Gross per 24 hour  Intake 2151.8 ml  Output   3245 ml  Net -1093.2 ml    Physical Examination: General:  No distress Neuro:  A&O x 3 HEENT:  PERRL Neck:  No  JVD Cardiovascular:  RRR, no murmurs Lungs:  CTAB, R lateral chest abrasion / ecchymosis  Abdomen:  Soft, nontender, bowel sounds present Musculoskeletal:  Upper / lower extremities deformities Skin:  No rash   Labs and Imaging:    Lab 08/10/11 1735 08/10/11 0957  CKTOTAL 345* 257*  CKMB 10.8* 7.9*  TROPONINI <0.30 <0.30    Lab 08/11/11 0310 08/10/11 0957  PROBNP 449.8* 74.1     Lab 08/11/11 0310 08/10/11 0957  08/10/11 0208 08/10/11 0140  NA 138 140 142 --  K 3.6 4.3 3.2* --  CL 108 112 106 --  CO2 21 16* -- 25  BUN <3* 10 10 --  CREATININE 0.30* 0.41* 0.60 --  GLUCOSE 115* 44* 71 --    Lab 08/11/11 0310 08/10/11 1735 08/10/11 1138 08/10/11 0957 08/10/11 0140  HGB 12.0* 12.8* 12.9* -- --  HCT 34.7* 37.6* 38.4* -- --  WBC 12.5* -- -- 18.6* 13.3*  PLT 199 -- -- 215 267   ABG    Component Value Date/Time   HCO3 21.5 08/10/2011 0225   TCO2 19.2 08/10/2011 0225   ACIDBASEDEF 2.9* 08/10/2011 0225   O2SAT 88.8 08/10/2011 0225   Ct Head Wo Contrast  08/10/2011  *RADIOLOGY REPORT*  Clinical Data: Overdose; unresponsive.  Patient intubated.  CT HEAD WITHOUT CONTRAST  Technique:  Contiguous axial images were obtained from the base of the skull through the vertex without contrast.  Comparison: None.  Findings: There is no evidence of acute infarction, mass lesion, or intra- or extra-axial hemorrhage on CT.  The posterior fossa, including the cerebellum, brainstem and fourth ventricle, is within normal limits.  The third and lateral ventricles, and basal ganglia are unremarkable in appearance.  The cerebral hemispheres are symmetric in appearance, with normal gray- white differentiation.  No mass effect or midline shift is  seen.  There is no evidence of fracture; visualized osseous structures are unremarkable in appearance.  The orbits are within normal limits. The paranasal sinuses and mastoid air cells are well-aerated.  No significant soft tissue abnormalities are seen.  IMPRESSION: Unremarkable noncontrast CT of the head.  Original Report Authenticated By: Tonia Ghent, M.D.   Ct Abdomen Pelvis W Contrast  08/10/2011  *RADIOLOGY REPORT*  Clinical Data: Overdose; unresponsive.  Right upper quadrant ecchymosis.  Leukocytosis.  CT ABDOMEN AND PELVIS WITH CONTRAST  Technique:  Multidetector CT imaging of the abdomen and pelvis was performed following the standard protocol during bolus administration of  intravenous contrast.  Contrast: OMNIPAQUE IOHEXOL 300 MG/ML IV SOLN  Comparison: None  Findings: Minimal bibasilar atelectasis is noted.  The liver and spleen are unremarkable in appearance.  The gallbladder is within normal limits.  The pancreas and adrenal glands are unremarkable.  There is incomplete rotation of both kidneys.  No perinephric stranding is seen.  No hydronephrosis is appreciated.  No renal or ureteral stones are identified.  The kidneys are otherwise unremarkable in appearance.  No free fluid is identified.  The small bowel is unremarkable in appearance.  The stomach is within normal limits; the patient's enteric tube is noted ending at the body of the stomach.  No acute vascular abnormalities are seen.  The appendix remains normal in caliber.  Trace fluid about the tip of the appendix reflects physiologic fluid, in the pelvic cul-de- sac.  The colon is largely decompressed and is unremarkable in appearance.  The sigmoid colon is mildly redundant.  The bladder is decompressed; bladder wall thickening may reflect chronic inflammation.  A Foley catheter is noted in expected position.  The prostate remains normal in size.  No inguinal lymphadenopathy is seen.  No acute osseous abnormalities are identified.  IMPRESSION:  1.  No acute abnormalities seen within the abdomen and pelvis. 2.  Bladder wall thickening may reflect chronic inflammation.  Original Report Authenticated By: Tonia Ghent, M.D.   Dg Chest Port 1 View  08/11/2011  *RADIOLOGY REPORT*  Clinical Data: Respiratory failure, follow-up  PORTABLE CHEST - 1 VIEW  Comparison: Portable chest x-ray of 08/10/2011  Findings: The endotracheal tube and NG tube have been removed as has the right IJ central venous line.  The lungs are not as well aerated and there is basilar atelectasis and perhaps mild pulmonary vascular congestion present.  The heart is within upper limits of normal.  The appearance of the right humerus humeral head is  unusual and could be positional, but dislocation cannot be excluded and clinical correlation is recommended.  IMPRESSION:  1.  Removal of endotracheal tube, NG tube, and central venous line. 2.  Diminished aeration with question of mild pulmonary vascular congestion. 3.  Cannot exclude posterior dislocation of the right shoulder. Correlate clinically.  Original Report Authenticated By: Juline Patch, M.D.   Dg Chest Portable 1 View  08/10/2011  *RADIOLOGY REPORT*  Clinical Data: Central line placement  PORTABLE CHEST - 1 VIEW  Comparison: 08/10/2011  Findings: Retraction of right IJ central line to the SVC RA junction region.  Endotracheal tube and NG tube stable position. Normal heart size and vascularity.  Negative for focal airspace process or edema.  No effusion or pneumothorax.  IMPRESSION: Retracted right IJ central line now at the SVC/RA junction. Otherwise stable portable chest exam.  Original Report Authenticated By: Judie Petit. Ruel Favors, M.D.   Dg Chest Portable 1 View  08/10/2011  *RADIOLOGY REPORT*  Clinical Data: Endotracheal tube and central line placement; overdose, with altered mental status.  PORTABLE CHEST - 1 VIEW  Comparison: None.  Findings: The patient's endotracheal tube is seen ending 3-4 cm above the carina.  A right IJ line is noted extending through the right atrium and ending within the intrahepatic IVC.  This should be retracted at least 10-11 cm.  An enteric tube is noted ending at the fundus of the stomach.  Mild vascular congestion is noted; the lungs remain grossly clear. No pleural effusion or pneumothorax is seen.  The cardiomediastinal silhouette is borderline normal in size.  No acute osseous abnormalities are identified.  The stomach is partially filled with air.  IMPRESSION:  1.  Endotracheal tube seen ending 3-4 cm above the carina. 2.  Right IJ line noted ending within the intrahepatic IVC; this should be retracted at least 10-11 cm. 3.  Mild vascular congestion; lungs  remain clear.  Findings were discussed with Dr. Sunnie Nielsen at 01:26 a.m. on 08/10/2011.  Original Report Authenticated By: Tonia Ghent, M.D.    Assessment and Plan:  Encephalopathy secondary to polysubstance abuse.  Tox screen positive for benzodiazepines, opioids and cannabinoids. History of seizures, but no active seizures.  Sub therapeutic valproic acid level. Question noncompliance. CT head unremarkable.  Plan: --> continue Depakote (home med) --> psych consult requested 1/17  Hypotension/shock (unclear etiology). Suspect multi-factorial: residual sedation effect, hypovolemia.  Resolved 1/18.  Lab 08/11/11 0310 08/10/11 1350 08/10/11 0957 08/10/11 0200 08/10/11 0140  PROCALCITON 0.30 0.22 -- -- --  WBC 12.5* -- 18.6* -- 13.3*  LATICACIDVEN -- -- 0.4* 0.9 --  plan: -continue IV fluids  Acute respiratory failure secondary to inability to protect airways. -->  Successfully extubated 1/17 -->  Bronchial hygiene  Acute upper gastrointestinal hemorrhage, etiology is not clear. Suspect NGT trauma.  No recurrence 1/18.  Lab 08/11/11 0310 08/10/11 1735 08/10/11 1138 08/10/11 0957  HGB 12.0* 12.8* 12.9* 12.6*  HCT 34.7* 37.6* 38.4* 37.2*  plan: -->  Change protonix to po daily -->  Advance diet -->  F/u CBC -->  SCDs   Hypokalemia  Lab 08/11/11 0310 08/10/11 0957 08/10/11 0208 08/10/11 0140  K 3.6 4.3 3.2* 3.0*   -->  Replace & recheck as needed  Best practices / Disposition -->  Transfer to medical floor non-tele bed -->  full code -->  SCDs for DVT Px  Will ask [facility] to assume care from 1/19 and PCCM sign off.  Coralyn Helling, MD 08/11/2011, 9:20 AM Pager:  [phone]
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
## 2011-08-12 LAB — BASIC METABOLIC PANEL
BUN: <3 mg/dL — ABNORMAL LOW (ref 6–23)
CO2: 25 mEq/L (ref 19–32)
GFR calc non Af Amer: >90 mL/min (ref >90)
Glucose, Bld: 93 mg/dL (ref 70–99)
Potassium: 3.3 mEq/L — ABNORMAL LOW (ref 3.5–5.1)

## 2011-08-12 LAB — CBC
Hemoglobin: 12.2 g/dL — ABNORMAL LOW (ref 13.0–17.0)
MCH: 34.2 pg — ABNORMAL HIGH (ref 26.0–34.0)
MCHC: 36.2 g/dL — ABNORMAL HIGH (ref 30.0–36.0)
MCV: 94.4 fL (ref 78.0–100.0)

## 2011-08-12 MED ORDER — POTASSIUM CHLORIDE CRYS ER 20 MEQ PO TBCR
40.0000 meq | EXTENDED_RELEASE_TABLET | Freq: Once | ORAL | Status: DC
  Filled 2011-08-12: qty 2

## 2011-08-12 MED ORDER — VALPROIC ACID 250 MG PO CAPS: 250.0000 mg | ORAL_CAPSULE | Freq: Two times a day (BID) | ORAL | Status: DC

## 2011-08-12 MED ORDER — CLONAZEPAM 1 MG PO TABS: 0.5000 mg | ORAL_TABLET | Freq: Two times a day (BID) | ORAL | Status: DC | PRN

## 2011-08-12 NOTE — Discharge Summary (Signed)
[name], 31 y.o., DOB Feb 19, 1981, MRN [id number]. Admission date: 08/10/2011 Discharge Date 08/12/2011 Primary MD No primary provider on file. Admitting Physician Lonia Farber, MD  Admission Diagnosis  Respiratory failure [518.81] Overdose [977.9] OVERDOSE  Discharge Diagnosis   - Intentional Drug Overdose      Past Medical History  Diagnosis Date  . Seizures   . Arthrogryposis     wheel chair bound    Past Surgical History  Procedure Date  . Knee tendonotomy     as a child     Hospital Course See H&P, Labs, Consult and Test reports for all details in brief, patient was admitted for  - intentional polypharmacy overdose causing decreased mental status requiring endotracheal intubation for airway protection, did by critical care team to ICU, was provided supportive care, he was initially hypotensive due to chemical effects off the medications marijuana, Klonopin.   Supportive care patient's mental status improved, extubated, then transferred to medical care unit, wrist Dr. Mickeal Skinner and cleared for home discharge,Ready been seen by the social worker and he will be seen by case manager and set up with outpatient PCP.  In addition today patient is alert awake in bed in good spirits, denies any suicidal or homicidal ideations, he has good range of motion in both upper extremities, above dictated x-ray with possible right shoulder dislocation which was done yesterday by critical care, does not correlate clinically, right shoulder tenderness,  good range of motion, is on palpation, no obvious deformity on examination of the shoulder. Has no cough no shortness of breath, no headache, possible mild basilar atelectasis he will take his incentive spirometer  Home which he is already using here, home today as he would like his parents to see his new apartment, denies any intentions of hurting himself he says that if he feels sad or depressed he will called the help line for  assistance.  The social worker to see him again and provide him with necessary documentation to seek help, and case manager to provide him with a PCP number. Reactionary leukocytosis is improving needs to be monitored outpatient patient is afebrile no signs of active infection.    Consults Psych  Significant Tests:  See full reports for all details     Ct Head Wo Contrast  08/10/2011  *RADIOLOGY REPORT*  Clinical Data: Overdose; unresponsive.  Patient intubated.  CT HEAD WITHOUT CONTRAST  Technique:  Contiguous axial images were obtained from the base of the skull through the vertex without contrast.  Comparison: None.  Findings: There is no evidence of acute infarction, mass lesion, or intra- or extra-axial hemorrhage on CT.  The posterior fossa, including the cerebellum, brainstem and fourth ventricle, is within normal limits.  The third and lateral ventricles, and basal ganglia are unremarkable in appearance.  The cerebral hemispheres are symmetric in appearance, with normal gray- white differentiation.  No mass effect or midline shift is seen.  There is no evidence of fracture; visualized osseous structures are unremarkable in appearance.  The orbits are within normal limits. The paranasal sinuses and mastoid air cells are well-aerated.  No significant soft tissue abnormalities are seen.  IMPRESSION: Unremarkable noncontrast CT of the head.  Original Report Authenticated By: Tonia Ghent, M.D.   Ct Abdomen Pelvis W Contrast  08/10/2011  *RADIOLOGY REPORT*  Clinical Data: Overdose; unresponsive.  Right upper quadrant ecchymosis.  Leukocytosis.  CT ABDOMEN AND PELVIS WITH CONTRAST  Technique:  Multidetector CT imaging of the abdomen and pelvis was performed following  the standard protocol during bolus administration of intravenous contrast.  Contrast: OMNIPAQUE IOHEXOL 300 MG/ML IV SOLN  Comparison: None  Findings: Minimal bibasilar atelectasis is noted.  The liver and spleen are unremarkable  in appearance.  The gallbladder is within normal limits.  The pancreas and adrenal glands are unremarkable.  There is incomplete rotation of both kidneys.  No perinephric stranding is seen.  No hydronephrosis is appreciated.  No renal or ureteral stones are identified.  The kidneys are otherwise unremarkable in appearance.  No free fluid is identified.  The small bowel is unremarkable in appearance.  The stomach is within normal limits; the patient's enteric tube is noted ending at the body of the stomach.  No acute vascular abnormalities are seen.  The appendix remains normal in caliber.  Trace fluid about the tip of the appendix reflects physiologic fluid, in the pelvic cul-de- sac.  The colon is largely decompressed and is unremarkable in appearance.  The sigmoid colon is mildly redundant.  The bladder is decompressed; bladder wall thickening may reflect chronic inflammation.  A Foley catheter is noted in expected position.  The prostate remains normal in size.  No inguinal lymphadenopathy is seen.  No acute osseous abnormalities are identified.  IMPRESSION:  1.  No acute abnormalities seen within the abdomen and pelvis. 2.  Bladder wall thickening may reflect chronic inflammation.  Original Report Authenticated By: Tonia Ghent, M.D.   Dg Chest Port 1 View  08/11/2011  *RADIOLOGY REPORT*  Clinical Data: Respiratory failure, follow-up  PORTABLE CHEST - 1 VIEW  Comparison: Portable chest x-ray of 08/10/2011  Findings: The endotracheal tube and NG tube have been removed as has the right IJ central venous line.  The lungs are not as well aerated and there is basilar atelectasis and perhaps mild pulmonary vascular congestion present.  The heart is within upper limits of normal.  The appearance of the right humerus humeral head is unusual and could be positional, but dislocation cannot be excluded and clinical correlation is recommended.  IMPRESSION:  1.  Removal of endotracheal tube, NG tube, and central venous  line. 2.  Diminished aeration with question of mild pulmonary vascular congestion. 3.  Cannot exclude posterior dislocation of the right shoulder. Correlate clinically.  Original Report Authenticated By: Juline Patch, M.D.   Dg Chest Portable 1 View  08/10/2011  *RADIOLOGY REPORT*  Clinical Data: Central line placement  PORTABLE CHEST - 1 VIEW  Comparison: 08/10/2011  Findings: Retraction of right IJ central line to the SVC RA junction region.  Endotracheal tube and NG tube stable position. Normal heart size and vascularity.  Negative for focal airspace process or edema.  No effusion or pneumothorax.  IMPRESSION: Retracted right IJ central line now at the SVC/RA junction. Otherwise stable portable chest exam.  Original Report Authenticated By: Judie Petit. Ruel Favors, M.D.   Dg Chest Portable 1 View  08/10/2011  *RADIOLOGY REPORT*  Clinical Data: Endotracheal tube and central line placement; overdose, with altered mental status.  PORTABLE CHEST - 1 VIEW  Comparison: None.  Findings: The patient's endotracheal tube is seen ending 3-4 cm above the carina.  A right IJ line is noted extending through the right atrium and ending within the intrahepatic IVC.  This should be retracted at least 10-11 cm.  An enteric tube is noted ending at the fundus of the stomach.  Mild vascular congestion is noted; the lungs remain grossly clear. No pleural effusion or pneumothorax is seen.  The cardiomediastinal silhouette is  borderline normal in size.  No acute osseous abnormalities are identified.  The stomach is partially filled with air.  IMPRESSION:  1.  Endotracheal tube seen ending 3-4 cm above the carina. 2.  Right IJ line noted ending within the intrahepatic IVC; this should be retracted at least 10-11 cm. 3.  Mild vascular congestion; lungs remain clear.  Findings were discussed with Dr. Sunnie Nielsen at 01:26 a.m. on 08/10/2011.  Original Report Authenticated By: Tonia Ghent, M.D.     Today   Subjective:   Earlin Sweeden today has no headache,no chest abdominal pain,no new weakness tingling or numbness, feels much better wants to go home today.    Objective:   Blood pressure 103/62, pulse 66, temperature 97.8 F (36.6 C), temperature source Oral, resp. rate 18, height 5' (1.524 m), weight 51 kg (112 lb 7 oz), SpO2 98.00%.  Intake/Output Summary (Last 24 hours) at 08/12/11 1247 Last data filed at 08/12/11 0900  Gross per 24 hour  Intake   1130 ml  Output   1500 ml  Net   -370 ml    Exam Awake Alert, Oriented *3, No new F.N deficits, Normal affect [place].AT,PERRAL Supple Neck,No JVD, No cervical lymphadenopathy appriciated.  Symmetrical Chest wall movement, Good air movement bilaterally, CTAB RRR,No Gallops,Rubs or new Murmurs, No Parasternal Heave +ve B.Sounds, Abd Soft, Non tender, No organomegaly appriciated, No rebound -guarding or rigidity. No Cyanosis, Clubbing or edema, No new Rash or bruise  Data Review      CBC w Diff: Lab Results  Component Value Date   WBC 12.1* 08/12/2011   HGB 12.2* 08/12/2011   HCT 33.7* 08/12/2011   PLT 190 08/12/2011   CMP: Lab Results  Component Value Date   NA 140 08/12/2011   K 3.3* 08/12/2011   CL 106 08/12/2011   CO2 25 08/12/2011   BUN <3* 08/12/2011   CREATININE 0.32* 08/12/2011   PROT 7.1 08/10/2011   ALBUMIN 4.2 08/10/2011   BILITOT 0.3 08/10/2011   ALKPHOS 73 08/10/2011   AST 33 08/10/2011   ALT 27 08/10/2011  .  Micro Results Recent Results (from the past 240 hour(s))  MRSA PCR SCREENING     Status: Normal   Collection Time   08/10/11  5:18 AM      Component Value Range Status Comment   MRSA by PCR NEGATIVE  NEGATIVE  Final      Discharge Instructions     Follow with Primary MD in 3-4 days   Get CBC, CMP, checked 3-4 days by Primary MD and again as instructed by your Primary MD. Get a 2 view Chest X ray done next visit if you had Pneumonia of Lung problems at the Hospital.  Get Medicines reviewed and adjusted.  Please request your  Prim.MD to go over all Hospital Tests and Procedure/Radiological results at the follow up, please get all Hospital records sent to your Prim MD by signing hospital release before you go home.  Follow-up Information    Follow up with [facility]. Call in 2 days.      Follow up with Mickeal Skinner, MD. Call in 2 days.   Contact information:   [address] [place] [address] [phone]          Activity: Fall precautions use walker/cane & assistance as needed  Diet: Heart Healthy, Aspiration precautions.  For Heart failure patients - Check your Weight same time everyday, if you gain over 2 pounds, or you develop  in leg swelling, experience more shortness of breath or chest pain, call your Primary MD immediately. Follow Cardiac Low Salt Diet and 1.8 lit/day fluid restriction.  Disposition Home  If you experience worsening of your admission symptoms, develop shortness of breath, life threatening emergency, suicidal or homicidal thoughts you must seek medical attention immediately by calling 911 or calling your MD immediately  if symptoms less severe.  You Must read complete instructions/literature along with all the possible adverse reactions/side effects for all the Medicines you take and that have been prescribed to you. Take any new Medicines after you have completely understood and accpet all the possible adverse reactions/side effects.   Do not drive if your were admitted for syncope or siezures until you have seen by Primary MD or a Neurologist and advised to drive.  Do not drive when taking Pain medications.    Do not take more than prescribed Pain, Sleep and Anxiety Medications  Special Instructions: If you have smoked or chewed Tobacco  in the last 2 yrs please stop smoking, stop any regular Alcohol  and or any Recreational drug use.  Wear Seat belts while driving.  Follow-up Information    Follow up with [facility]. Call in  2 days.      Follow up with Mickeal Skinner, MD. Call in 2 days.   Contact information:   [address] [place] [address] [phone]          Discharge Medications   Medication List  As of 08/12/2011 12:47 PM   CHANGE how you take these medications         clonazePAM 1 MG tablet   Commonly known as: KLONOPIN   Take 0.5 tablets (0.5 mg total) by mouth 2 (two) times daily as needed.   What changed: - dose - how often to take the med         CONTINUE taking these medications         valproic acid 250 MG capsule   Commonly known as: DEPAKENE   Take 1-2 capsules (250-500 mg total) by mouth 2 (two) times daily. 1 capsule in the morning and 2 at night          Where to get your medications    These are the prescriptions that you need to pick up.   You may get these medications from any pharmacy.         clonazePAM 1 MG tablet   valproic acid 250 MG capsule             Total Time in preparing paper work, data evaluation and todays exam - 35 minutes  Leroy Sea M.D on 08/12/2011 at 12:47 PM  [facility] Office  [phone]

## 2011-08-12 NOTE — Progress Notes (Signed)
Discharge teaching completed. Handouts given and reviewed on adult overdose, Md. Discharge teaching summary. Prescription given on Depakote and handout given. Pt. Left via wheel chair with family. No resp.distress noted. Pt. To follow up with primary care MD. Pt. Was seen by Social worker and Case Manager prior to discharge.

## 2011-08-13 NOTE — Progress Notes (Signed)
Provided Pt with outpt tx providers/information.  Answered questions about providers' proximity to Pt's home.  Unable to answer Pt's insurance questions, as the providers that are in close proximity to Pt were closed.  Pt to be d/c'd.  Providence Crosby, LCSWA Clinical Social Work [phone]

## 2011-08-14 LAB — GLUCOSE, CAPILLARY: Glucose-Capillary: 75 mg/dL (ref 70–99)

## 2011-12-04 ENCOUNTER — Emergency Department (HOSPITAL_COMMUNITY)
Admission: EM | Admit: 2011-12-04 | Discharge: 2011-12-04 | Discharge status: Against Medical Advice | Payer: Medicare Other | Attending: Emergency Medicine | Admitting: Emergency Medicine

## 2011-12-04 DIAGNOSIS — F489 Nonpsychotic mental disorder, unspecified: Secondary | ICD-10-CM | POA: Insufficient documentation

## 2011-12-04 NOTE — ED Notes (Signed)
Pt presented to the ER with police escort, pt state " I am here to talk to somebody about of my state of mind." Pt at this time denies any SI or HI ideations.

## 2011-12-04 NOTE — ED Notes (Signed)
Pt alert, sitting in the wheelchair, ACT team contacted for additional information for the resources. Pt at this time is not checking in.

## 2011-12-04 NOTE — ED Notes (Signed)
Pt asking about wait time, pt informed that at time not able to predict exact wait time, pt states he is not willing to wait, he wasn't to be see now, and that if he cannot be seen he will ho back home. [employer] at the triage window with pt, pt asking for ride back home. Waiting to see if pt is willing to stay or will decide to go home.

## 2012-01-05 ENCOUNTER — Encounter (HOSPITAL_COMMUNITY): Payer: Self-pay | Admitting: *Deleted

## 2012-01-05 ENCOUNTER — Emergency Department (HOSPITAL_COMMUNITY)
Admission: EM | Admit: 2012-01-05 | Discharge: 2012-01-05 | Disposition: A | Discharge status: Home / Self Care | Payer: Medicare Other | Attending: Emergency Medicine | Admitting: Emergency Medicine

## 2012-01-05 DIAGNOSIS — F1210 Cannabis abuse, uncomplicated: Secondary | ICD-10-CM

## 2012-01-05 DIAGNOSIS — M6289 Other specified disorders of muscle: Secondary | ICD-10-CM | POA: Insufficient documentation

## 2012-01-05 DIAGNOSIS — Z79899 Other long term (current) drug therapy: Secondary | ICD-10-CM | POA: Insufficient documentation

## 2012-01-05 DIAGNOSIS — G40909 Epilepsy, unspecified, not intractable, without status epilepticus: Secondary | ICD-10-CM | POA: Insufficient documentation

## 2012-01-05 DIAGNOSIS — F1010 Alcohol abuse, uncomplicated: Secondary | ICD-10-CM

## 2012-01-05 DIAGNOSIS — F10929 Alcohol use, unspecified with intoxication, unspecified: Secondary | ICD-10-CM

## 2012-01-05 DIAGNOSIS — R45851 Suicidal ideations: Secondary | ICD-10-CM | POA: Insufficient documentation

## 2012-01-05 HISTORY — DX: Major depressive disorder, single episode, unspecified: F32.9

## 2012-01-05 HISTORY — DX: Depression, unspecified: F32.A

## 2012-01-05 HISTORY — DX: Anxiety disorder, unspecified: F41.9

## 2012-01-05 HISTORY — DX: Mental disorder, not otherwise specified: F99

## 2012-01-05 LAB — COMPREHENSIVE METABOLIC PANEL
Albumin: 4 g/dL (ref 3.5–5.2)
Alkaline Phosphatase: 78 U/L (ref 39–117)
BUN: 5 mg/dL — ABNORMAL LOW (ref 6–23)
CO2: 21 mEq/L (ref 19–32)
Chloride: 102 mEq/L (ref 96–112)
Creatinine, Ser: 0.29 mg/dL — ABNORMAL LOW (ref 0.50–1.35)
GFR calc Af Amer: >90 mL/min (ref >90)
GFR calc non Af Amer: >90 mL/min (ref >90)
Glucose, Bld: 83 mg/dL (ref 70–99)
Total Bilirubin: 0.3 mg/dL (ref 0.3–1.2)

## 2012-01-05 LAB — RAPID URINE DRUG SCREEN, HOSP PERFORMED: Barbiturates: NOT DETECTED

## 2012-01-05 LAB — DIFFERENTIAL
Basophils Relative: 0 % (ref 0–1)
Eosinophils Relative: 2 % (ref 0–5)
Lymphocytes Relative: 57 % — ABNORMAL HIGH (ref 12–46)
Monocytes Relative: 6 % (ref 3–12)
Neutro Abs: 5 10*3/uL (ref 1.7–7.7)

## 2012-01-05 LAB — CBC
HCT: 45.1 % (ref 39.0–52.0)
Hemoglobin: 15.7 g/dL (ref 13.0–17.0)
MCV: 95.6 fL (ref 78.0–100.0)
RBC: 4.72 MIL/uL (ref 4.22–5.81)
WBC: 14.2 10*3/uL — ABNORMAL HIGH (ref 4.0–10.5)

## 2012-01-05 LAB — PATHOLOGIST SMEAR REVIEW

## 2012-01-05 LAB — ACETAMINOPHEN LEVEL: Acetaminophen (Tylenol), Serum: <15 ug/mL (ref 10–30)

## 2012-01-05 LAB — ETHANOL: Alcohol, Ethyl (B): 158 mg/dL — ABNORMAL HIGH (ref 0–11)

## 2012-01-05 MED ORDER — CLONAZEPAM 0.5 MG PO TABS
1.0000 mg | ORAL_TABLET | Freq: Once | ORAL | Status: AC
  Administered 2012-01-05: 1 mg via ORAL
  Filled 2012-01-05: qty 2

## 2012-01-05 MED ORDER — CLONAZEPAM 0.5 MG PO TABS: 0.5000 mg | ORAL_TABLET | Freq: Two times a day (BID) | ORAL | Status: DC

## 2012-01-05 MED ORDER — DIVALPROEX SODIUM 500 MG PO DR TAB
500.0000 mg | DELAYED_RELEASE_TABLET | Freq: Every day | ORAL | Status: DC
  Filled 2012-01-05: qty 1

## 2012-01-05 MED ORDER — DIVALPROEX SODIUM 250 MG PO DR TAB
250.0000 mg | DELAYED_RELEASE_TABLET | Freq: Every morning | ORAL | Status: DC
  Administered 2012-01-05: 250 mg via ORAL
  Filled 2012-01-05: qty 1

## 2012-01-05 MED ORDER — DIVALPROEX SODIUM 500 MG PO DR TAB: 500.0000 mg | DELAYED_RELEASE_TABLET | Freq: Once | ORAL | Status: DC

## 2012-01-05 MED ORDER — CLONAZEPAM 0.5 MG PO TABS
0.5000 mg | ORAL_TABLET | Freq: Two times a day (BID) | ORAL | Status: DC | PRN
  Administered 2012-01-05: 0.5 mg via ORAL
  Filled 2012-01-05: qty 1
  Filled 2012-01-05: qty 2

## 2012-01-05 NOTE — ED Notes (Addendum)
Pt agitated and uncooperative. Wanting to leave stating he is not suicidal. Explained to pt that he needs to stay to get treatment for his own safety. Pt refusing any medications. Dr. Brooke Dare notified and working on IVC paperwork.  ACT team notified. Sitter remains at bedside.

## 2012-01-05 NOTE — ED Notes (Signed)
Pt eating lunch, waiting for friend to come pick him up.

## 2012-01-05 NOTE — BH Assessment (Signed)
Assessment Note   [name] is an 31 y.o. male. Pt presents to [facility] with chief complaint  of alcohol intoxication and verbalized  suicidal threat while intoxicated. Pt presents angry and agitated.Pt reports smoking 1 blunt daily and reports last use was on 01-05-12(1)blunt. Pt reports occasional etoh use and appears to be minimizing etoh use,as pt was intoxicated when picked up by police. Pt reports that he does not remember anything that happened while he was intoxicated.Pt upset and fixated on his property manager fixing his fan and his stove. Pt requesting to be discharged home so he can handle personal issues with his Investment banker, corporate. Pt denies current SI,HI, and no AVH. Pt is able to contract for safety. Pt evaluated by Dr. Elsie Saas who recommended pt to be discharged. EDP Dr. Brooke Dare consulted and agreed to discharge pt home with referrals. Referrals and recommendations for follow-up care also provided by Ed staff and Act team.  Axis I: Alcohol Intoxication Axis II: Deferred Axis III:  Past Medical History  Diagnosis Date  . Seizures   . Arthrogryposis     wheel chair bound  . Mental disorder   . Depression   . Anxiety    Axis IV: housing problems, other psychosocial or environmental problems, problems related to social environment and problems with primary support group Axis V: 51-60 moderate symptoms  Past Medical History:  Past Medical History  Diagnosis Date  . Seizures   . Arthrogryposis     wheel chair bound  . Mental disorder   . Depression   . Anxiety     Past Surgical History  Procedure Date  . Knee tendonotomy     as a child    Family History: No family history on file.  Social History:  reports that he has been smoking Cigarettes.  He has never used smokeless tobacco. He reports that he drinks alcohol. He reports that he uses illicit drugs (Marijuana).  Additional Social History:  Alcohol / Drug Use Pain Medications:  (None Reported) Prescriptions:   (yes) Over the Counter:  (None) History of alcohol / drug use?: Yes Substance #1 Name of Substance 1:  (Cannibus) 1 - Age of First Use:  (20) 1 - Amount (size/oz):  (1 blunt ) 1 - Frequency:  (Daily) 1 - Duration:  (On-going use since age 77) 1 - Last Use / Amount:  (01-05-12;1 blunt)  CIWA: CIWA-Ar BP: 103/77 mmHg Pulse Rate: 91  COWS:    Allergies:  Allergies  Allergen Reactions  . Amoxicillin Other (See Comments)    Unsure of rxn  . Morphine And Related Hives    Home Medications:  (Not in a hospital admission)  OB/GYN Status:  No LMP for male patient.  General Assessment Data Location of Assessment: [facility] ED ACT Assessment: Yes Living Arrangements: Alone Can pt return to current living arrangement?: Yes Admission Status: Other (Comment) (IVC rescinded by Dr.Jonnalagadda) Transfer from: Other (Comment) Referral Source: Other Mudlogger)     Risk to self Suicidal Ideation: No (police report pt made statement while intoxicated) Suicidal Intent: No Is patient at risk for suicide?: No Suicidal Plan?: No Access to Means: No What has been your use of drugs/alcohol within the last 12 months?: Etoh and Marijuana Previous Attempts/Gestures: No (denies hx) How many times?: 0  Other Self Harm Risks: none reported Triggers for Past Attempts: None known Intentional Self Injurious Behavior: None Family Suicide History: No (pt reports that he is adopted, bio parents deceased) Recent stressful life event(s): Conflict (  Comment);Turmoil (Comment);Other (Comment) (issues w/landlord,no supports) Persecutory voices/beliefs?: No Depression: Yes Depression Symptoms: Tearfulness;Feeling worthless/self pity;Feeling angry/irritable Substance abuse history and/or treatment for substance abuse?: Yes (minimizing etoh use) Suicide prevention information given to non-admitted patients: Yes  Risk to Others Homicidal Ideation: No Thoughts of Harm to Others: No Current Homicidal  Intent: No Current Homicidal Plan: No Access to Homicidal Means: No Identified Victim: na History of harm to others?: No Assessment of Violence: None Noted Violent Behavior Description: Agitated,Irritated,Angry, Cooperating with (cooperating with assessor) Does patient have access to weapons?: No Criminal Charges Pending?: No Does patient have a court date: No  Psychosis Hallucinations: None noted Delusions: None noted  Mental Status Report Appear/Hygiene: Other (Comment) (Dressed in hospital scrubs) Eye Contact: Fair Motor Activity: Freedom of movement Speech: Pressured;Loud Level of Consciousness: Alert;Irritable Mood: Anxious;Angry Affect: Anxious;Appropriate to circumstance;Irritable Anxiety Level: Moderate Thought Processes: Coherent;Relevant;Circumstantial Judgement: Impaired Orientation: Person;Place;Time;Situation Obsessive Compulsive Thoughts/Behaviors: None  Cognitive Functioning Concentration: Decreased Memory: Recent Intact;Remote Intact IQ: Average Insight: Poor Impulse Control: Poor Appetite: Good Sleep: Decreased Total Hours of Sleep: 8  Vegetative Symptoms: None  ADLScreening [facility] Assessment Services) Patient's cognitive ability adequate to safely complete daily activities?: Yes Patient able to express need for assistance with ADLs?: Yes Independently performs ADLs?: Yes (does require wheelchair to ambulate)  Abuse/Neglect [facility]) Physical Abuse: Denies Verbal Abuse: Denies Sexual Abuse: Denies  Prior Inpatient Therapy Prior Inpatient Therapy: No Prior Therapy Dates: na Prior Therapy Facilty/Provider(s): na Reason for Treatment: na  Prior Outpatient Therapy Prior Outpatient Therapy: Yes Prior Therapy Dates: when i was younger no age or date specified Prior Therapy Facilty/Provider(s): Does not remember name Reason for Treatment: To address Adoption related issues  ADL Screening (condition at time of admission) Patient's cognitive ability  adequate to safely complete daily activities?: Yes Patient able to express need for assistance with ADLs?: Yes Independently performs ADLs?: Yes (does require wheelchair to ambulate) Weakness of Legs: None Weakness of Arms/Hands: None  Home Assistive Devices/Equipment Home Assistive Devices/Equipment: Wheelchair    Abuse/Neglect Assessment (Assessment to be complete while patient is alone) Physical Abuse: Denies Verbal Abuse: Denies Sexual Abuse: Denies Exploitation of patient/patient's resources: Denies Self-Neglect: Denies Values / Beliefs Cultural Requests During Hospitalization: None Spiritual Requests During Hospitalization: None   Advance Directives (For Healthcare) Advance Directive: Patient does not have advance directive Nutrition Screen Unintentional weight loss greater than 10lbs within the last month: No Problems chewing or swallowing foods and/or liquids: No Home Tube Feeding or Total Parenteral Nutrition (TPN): No  Additional Information 1:1 In Past 12 Months?: No CIRT Risk: No Elopement Risk: No Does patient have medical clearance?: No     Disposition:  Disposition Disposition of Patient: Other dispositions (Dr. Shela Commons, recommending dcd,EDP Discharged) Other disposition(s): Other (Comment) (Discharged home with outpt referrals)  On Site Evaluation by:   Reviewed with Physician:     Bjorn Pippin 01/05/2012 11:10 PM

## 2012-01-05 NOTE — ED Notes (Signed)
Pt brought in by [employer]; pt called [employer] stating he wanted to kill himself; per friend pt took more medicine than he was supposed to; friend at bedside states pt wanted to come to hospital to talk to someone.  On arrival pt argumentative, not cooperating, tearful.

## 2012-01-05 NOTE — ED Notes (Signed)
Pt getting agitated requesting Klonopin MD notified.

## 2012-01-05 NOTE — ED Notes (Signed)
[employer] and security at bedside 

## 2012-01-05 NOTE — ED Notes (Signed)
Unable to obtain all labs. Main lab notified and sending a phlebotomist to collect the rest.

## 2012-01-05 NOTE — ED Notes (Signed)
Pt placed in paper scrubs and socks with CN. All belongings placed in bag.

## 2012-01-05 NOTE — ED Notes (Signed)
Paperwork faxed over to [facility]. Notified of need for psych consult.

## 2012-01-05 NOTE — Discharge Instructions (Signed)
Depression, Adolescent and Adult Depression is a true and treatable medical condition. In general there are two kinds of depression:  Depression we all experience in some form. For example depression from the death of a loved one, financial distress or natural disasters will trigger or increase depression.   Clinical depression, on the other hand, appears without an apparent cause or reason. This depression is a disease. Depression may be caused by chemical imbalance in the body and brain or may come as a response to a physical illness. Alcohol and other drugs can cause depression.  DIAGNOSIS  The diagnosis of depression is usually based upon symptoms and medical history. TREATMENT  Treatments for depression fall into three categories. These are:  Drug therapy. There are many medicines that treat depression. Responses may vary and sometimes trial and error is necessary to determine the best medicines and dosage for a particular patient.   Psychotherapy, also called talking treatments, helps people resolve their problems by looking at them from a different point of view and by giving people insight into their own personal makeup. Traditional psychotherapy looks at a childhood source of a problem. Other psychotherapy will look at current conflicts and move toward solving those. If the cause of depression is drug use, counseling is available to help abstain. In time the depression will usually improve. If there were underlying causes for the chemical use, they can be addressed.   ECT (electroconvulsive therapy) or shock treatment is not as commonly used today. It is a very effective treatment for severe suicidal depression. During ECT electrical impulses are applied to the head. These impulses cause a generalized seizure. It can be effective but causes a loss of memory for recent events. Sometimes this loss of memory may include the last several months.  Treat all depression or suicide threats as  serious. Obtain professional help. Do not wait to see if serious depression will get better over time without help. Seek help for yourself or those around you. In the [place] the number to the National Suicide Help Lines With 24 Hour Help Are: 1-800-SUICIDE 5191157253 Document Released: 07/07/2000 Document Revised: 06/29/2011 Document Reviewed: 02/26/2008 [facility] Patient Information 2012 [place], [place].  RESOURCE GUIDE  Chronic Pain Problems: Contact [facility]  [phone] Patients need to be referred by their primary care doctor.  Insufficient Money for Medicine: Contact United Way:  call "211" or [facility] [phone].  No Primary Care Doctor: - Call Health Connect  [phone] - can help you locate a primary care doctor that  accepts your insurance, provides certain services, etc. - Physician Referral [phone]  Agencies that provide inexpensive medical care: - [facility]  [phone] - [facility]  [phone] - [facility]  [phone] [facility]  [phone] - Planned Parenthood  [phone] [facility]  [phone]  Medicaid-accepting [facility] Providers: - [facility]- [address], [address]  [phone], Mon-Fri 9am-7pm, Sat 9am-1pm - [facility]- [address] [place], Suite [place]  [phone] - [facility]- [address] [place]  [phone] [facility]- [address]  [phone] - Renaye Rakers- [address] [place], [address], [phone]  Only accepts [place] patients after they have their name  applied to their card  Self Pay (no insurance) in [place]: - Sickle Cell Patients: Dr Willey Blade, [facility]  [phone]  Vaughan Basta [place], [phone] - [facility]- [address] [place]  [phone]       Patrcia D[facility] [place]- [address]       -     [facility]- see information above (Speak to [employer] if you do not have insurance)       -  [facility]- [address] [place], [phone]       -  [facility] [place]- [address],  [phone]       -  [facility]- [address], [phone]       -  Dr Julio Sicks-  [address], [place], [phone]       -  [facility]- [address], [phone]       -  [facility]- [address], [phone], also [address], [phone]       -    [facility]- [address] [place], [phone], 1st & 3rd Saturday   every month, 10am-1pm  1) Find a Doctor and Pay Out of Pocket Although you won't have to find out who is covered by your insurance plan, it is a good idea to ask around and get recommendations. You will then need to call the office and see if the doctor you have chosen will accept you as a new patient and what types of options they offer for patients who are self-pay. Some doctors offer discounts or will set up payment plans for their patients who do not have insurance, but you will need to ask so you aren't surprised when you get to your appointment.  2) Contact Your Local Health Department Not all health departments have doctors that can see patients for sick visits, but many do, so it is worth a call to see if yours does. If you don't know where your local health department is, you can check in your phone book. The CDC also has a tool to help you locate your state's health department, and many state websites also have listings of all of their local health departments.  3) Find a Walk-in Clinic If your illness is not likely to be very severe or complicated, you may want to try a walk in clinic. These are popping up all over the country in pharmacies, drugstores, and shopping centers. They're usually staffed by nurse practitioners or physician assistants that have been trained to treat  common illnesses and complaints. They're usually fairly quick and inexpensive. However, if you have serious medical issues or chronic medical problems, these are probably not your best option  STD Testing - [facility] Department of [facility] [place], STD Clinic, [address] [place], phone [phone] or [phone].  Monday - Friday, call for an appointment. [facility] Department of [employer], STD Clinic, [address], [place], phone [phone] or [phone].  Monday - Friday, call for an appointment.  Abuse/Neglect: [facility] Child Abuse Hotline [phone] [facility] Child Abuse Hotline (281)565-0548 (After Hours)  Emergency Shelter:  [facility] 506-012-1296  Maternity Homes: - Room at the [place] [facility] [phone] - [facility] [phone]  MRSA Hotline #:   629 061 3319  [facility] Resources  [facility]  United Way [facility] Dept. [address]                 [address]         [address] [place] [address]  Blondell Reveal Phone:  [phone]                                  Phone:  [phone]                   Phone:  [phone]  [facility] Health, [phone] - [phone]       -     [facility] in [place], [address],                                  [phone], [facility] Child Abuse Hotline 430-778-9756 or (986) 427-0953 (After Hours)   Behavioral Health Services  Substance Abuse Resources: - [facility] Drug Services  [phone] - [facility] [phone] - [facility] [place] [phone] Floydene Flock [phone] - Residential & Outpatient Substance Abuse Program   [phone]  Psychological Services: [facility]  [phone] Services  [phone] - [facility], [phone] [place]. [address] [place], ACCESS LINE: [phone] or ([phone], [employer]  Dental Assistance  If unable to pay or uninsured, contact:  [facility] or [place] [facility]. to become qualified for the adult dental clinic.  Patients with Medicaid: [facility] [phone] [address], [phone] [address] [phone]  If unable to pay, or uninsured, contact [facility] [phone]) or [facility] Department [phone] in [place], [phone] in [facility]) to become qualified for the adult dental clinic  Other Low-Cost Community Dental Services: - [facility]- [address] [place], [place], [place], [address], [phone], [phone], 2nd and 4th Thursday of the month at 6:30am.  10 clients each day by appointment, can sometimes see walk-in patients if someone does not show for an appointment. [facility]- [address] Ether Griffins [place], [place], [address], [phone] - [facility]- [address] [place], [place], [address], [phone] - [facility]- [phone] [facility]- [phone] [facility] Department- [phone]

## 2012-01-05 NOTE — ED Notes (Signed)
Pt now agreeing to take medications at this time.

## 2012-01-05 NOTE — ED Notes (Signed)
Pt. and belongings wanded by security 

## 2012-01-05 NOTE — ED Provider Notes (Addendum)
Patient is resting comfortably. Remains stable overnight without any complaints. Awaiting formal disposition from psychiatry.  Labs reviewed.  Orders reviewed and appropriate   Dayton Bailiff, MD 01/05/12 [phone]  Patient became agitated and requesting to leave. Given the fact that he is not been evaluated at this time any had suicidal thoughts with a questionable attempt he will be IVC until further evaluated.  Dayton Bailiff, MD 01/05/12 1005  Patient has been evaluated by the psychiatry team. Evaluated by the psychiatrist who has performed rounds today. He feels the patient is safe for discharge to home. IVC paperwork has been rescinded. He will be discharged home  Dayton Bailiff, MD 01/05/12 1219

## 2012-01-05 NOTE — Consult Note (Signed)
Reason for Consult: Alcohol intoxication and marijuana abuse Referring Physician: Dr. Lavonda Jumbo is an 31 y.o. male.  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 Patient lives by himself in an apartment since October 2012 in [place], and receiving medication, Klonopin, and Depakote for anxiety and seizure disorder [facility]. He was relocated from [facility] and [place]. Patient was grew up with the attention deficit hyperactivity disorder, received stimulant medications and graduated from high school and also involved with the drinking and abusing marijuana, occasionally. Patient  was drunk last night and also smoking marijuana. Patient reported he does not sell drugs and don't do any hard-core drugs. Patient has no history of for behavioral health hospitalization since he was young. Patient denies current suicidal ideation, homicidal ideation, and psychosis. Patient was irritable, frustrated , for being in hospital and home and and he clear and fairly cooperative. He was found in his bed drinking the milk from [place] and asking to be released so that he can take care of his personal business of paying his bills and talking with his landlord / Production designer, theatre/television/film about fixing broken things in the apartment. He has no history of for suicidal attempt. Patient was not interested detox treatment and minimized his substance abuse.  Past Medical History  Diagnosis Date  . Seizures   . Arthrogryposis     wheel chair bound    Past Surgical History  Procedure Date  . Knee tendonotomy     as a child    No family history on file.  Social History:  reports that he has quit smoking. His smoking use included Cigarettes. He has never used smokeless tobacco. He reports that he drinks alcohol. He reports that he uses illicit drugs  (Marijuana).  Allergies:  Allergies  Allergen Reactions  . Amoxicillin Other (See Comments)    Unsure of rxn  . Morphine And Related Hives    Medications: I have reviewed the patient's current medications.  Results for orders placed during the hospital encounter of 01/05/12 (from the past 48 hour(s))  URINE RAPID DRUG SCREEN (HOSP PERFORMED)     Status: Abnormal   Collection Time   01/05/12  2:49 AM      Component Value Range Comment   Opiates NONE DETECTED  NONE DETECTED    Cocaine NONE DETECTED  NONE DETECTED    Benzodiazepines NONE DETECTED  NONE DETECTED    Amphetamines NONE DETECTED  NONE DETECTED    Tetrahydrocannabinol POSITIVE (*) NONE DETECTED    Barbiturates NONE DETECTED  NONE DETECTED   COMPREHENSIVE METABOLIC PANEL     Status: Abnormal   Collection Time   01/05/12  3:00 AM      Component Value Range Comment   Sodium 138  135 - 145 mEq/L    Potassium 5.1  3.5 - 5.1 mEq/L MODERATE HEMOLYSIS   Chloride 102  96 - 112 mEq/L    CO2 21  19 - 32 mEq/L    Glucose, Bld 83  70 - 99 mg/dL    BUN 5 (*) 6 - 23 mg/dL    Creatinine, Ser 1.61 (*) 0.50 - 1.35 mg/dL    Calcium 9.3  8.4 - 09.6 mg/dL    Total Protein 7.5  6.0 - 8.3 g/dL    Albumin 4.0  3.5 - 5.2 g/dL    AST 50 (*)  0 - 37 U/L MODERATE HEMOLYSIS   ALT 23  0 - 53 U/L MODERATE HEMOLYSIS   Alkaline Phosphatase 78  39 - 117 U/L MODERATE HEMOLYSIS   Total Bilirubin 0.3  0.3 - 1.2 mg/dL    GFR calc non Af Amer >90  >90 mL/min    GFR calc Af Amer >90  >90 mL/min   ETHANOL     Status: Abnormal   Collection Time   01/05/12  3:00 AM      Component Value Range Comment   Alcohol, Ethyl (B) 158 (*) 0 - 11 mg/dL   ACETAMINOPHEN LEVEL     Status: Normal   Collection Time   01/05/12  3:00 AM      Component Value Range Comment   Acetaminophen (Tylenol), Serum <15.0  10 - 30 ug/mL   VALPROIC ACID LEVEL     Status: Abnormal   Collection Time   01/05/12  3:00 AM      Component Value Range Comment   Valproic Acid Lvl 20.7 (*)  50.0 - 100.0 ug/mL   CBC     Status: Abnormal   Collection Time   01/05/12  3:33 AM      Component Value Range Comment   WBC 14.2 (*) 4.0 - 10.5 K/uL    RBC 4.72  4.22 - 5.81 MIL/uL    Hemoglobin 15.7  13.0 - 17.0 g/dL    HCT 16.1  09.6 - 04.5 %    MCV 95.6  78.0 - 100.0 fL    MCH 33.3  26.0 - 34.0 pg    MCHC 34.8  30.0 - 36.0 g/dL    RDW 40.9  81.1 - 91.4 %    Platelets 263  150 - 400 K/uL   SALICYLATE LEVEL     Status: Abnormal   Collection Time   01/05/12  3:33 AM      Component Value Range Comment   Salicylate Lvl <2.0 (*) 2.8 - 20.0 mg/dL REPEATED TO VERIFY  DIFFERENTIAL     Status: Abnormal   Collection Time   01/05/12  3:33 AM      Component Value Range Comment   Neutrophils Relative 35 (*) 43 - 77 %    Lymphocytes Relative 57 (*) 12 - 46 %    Monocytes Relative 6  3 - 12 %    Eosinophils Relative 2  0 - 5 %    Basophils Relative 0  0 - 1 %    Neutro Abs 5.0  1.7 - 7.7 K/uL    Lymphs Abs 8.0 (*) 0.7 - 4.0 K/uL    Monocytes Absolute 0.9  0.1 - 1.0 K/uL    Eosinophils Absolute 0.3  0.0 - 0.7 K/uL    Basophils Absolute 0.0  0.0 - 0.1 K/uL    WBC Morphology ABSOLUTE LYMPHOCYTOSIS   ATYPICAL LYMPHOCYTES    No results found.  Positive for anxiety, bad mood, excessive alcohol consumption and illegal drug usage Blood pressure 111/62, pulse 92, temperature 97.3 F (36.3 C), resp. rate 20, SpO2 99.00%.   Assessment/Plan: Alcohol abuse versus dependence and intoxication with a blood alcohol level 158. Marijuana abuse, tox screen for marijuana  Recommended outpatient psychiatric and substance abuse treatment. Referred for case management services. Patient does not meet criteria for acute psychiatric hospitalization.   Maurice Ramseur,JANARDHAHA R. 01/05/2012, 11:35 AM

## 2012-01-05 NOTE — ED Notes (Signed)
Dr. Shela Commons, psychiatrist, at bedside.

## 2012-01-05 NOTE — ED Notes (Addendum)
Pt belongings behind nurses station directly in front of pts room.  Security called to wand patient.

## 2012-01-05 NOTE — ED Provider Notes (Signed)
History     CSN: [id number]  Arrival date & time 01/05/12  0157   First MD Initiated Contact with Patient 01/05/12 0402      Chief Complaint  Patient presents with  . Suicidal    (Consider location/radiation/quality/duration/timing/severity/associated sxs/prior treatment) HPI Level 5 Caveat: altered mental status. This is a 31 year old male who was brought in by the police after stating he wanted to kill himself. He reportedly took 2 extra valproic acid tablets earlier. On arrival nursing staff noted him to be argumentative and uncooperative. At this time the patient is somnolent consistent with his elevated blood alcohol level.  Past Medical History  Diagnosis Date  . Seizures   . Arthrogryposis     wheel chair bound    Past Surgical History  Procedure Date  . Knee tendonotomy     as a child    No family history on file.  History  Substance Use Topics  . Smoking status: Former Smoker    Types: Cigarettes  . Smokeless tobacco: Never Used  . Alcohol Use: Yes     occasionally      Review of Systems  Unable to perform ROS   Allergies  Amoxicillin and Morphine and related  Home Medications   Current Outpatient Rx  Name Route Sig Dispense Refill  . CLONAZEPAM 1 MG PO TABS Oral Take 0.5 tablets (0.5 mg total) by mouth 2 (two) times daily as needed. 1 tablet 0  . VALPROIC ACID 250 MG PO CAPS Oral Take 1-2 capsules (250-500 mg total) by mouth 2 (two) times daily. 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  Summary of this visit's results, reviewed by myself:  Labs:  Results for orders placed during the hospital encounter of 01/05/12  COMPREHENSIVE METABOLIC PANEL      Component Value Range   Sodium 138  135 - 145 mEq/L   Potassium 5.1  3.5 - 5.1 mEq/L   Chloride 102  96 - 112 mEq/L   CO2 21  19 - 32 mEq/L   Glucose, Bld 83  70 - 99 mg/dL   BUN 5 (*) 6 - 23 mg/dL   Creatinine, Ser 1.91 (*) 0.50 - 1.35 mg/dL   Calcium 9.3  8.4 - 47.8 mg/dL   Total Protein 7.5  6.0 - 8.3 g/dL   Albumin 4.0  3.5 - 5.2 g/dL   AST 50 (*) 0 - 37 U/L   ALT 23  0 - 53 U/L   Alkaline Phosphatase 78  39 - 117 U/L   Total Bilirubin 0.3  0.3 - 1.2 mg/dL   GFR calc non Af Amer >90  >90 mL/min   GFR calc Af Amer >90  >90 mL/min  ETHANOL      Component Value  Range   Alcohol, Ethyl (B) 158 (*) 0 - 11 mg/dL  ACETAMINOPHEN LEVEL      Component Value Range   Acetaminophen (Tylenol), Serum <15.0  10 - 30 ug/mL  URINE RAPID DRUG SCREEN (HOSP PERFORMED)      Component Value Range   Opiates NONE DETECTED  NONE DETECTED   Cocaine NONE DETECTED  NONE DETECTED   Benzodiazepines NONE DETECTED  NONE DETECTED   Amphetamines NONE DETECTED  NONE DETECTED   Tetrahydrocannabinol POSITIVE (*) NONE DETECTED   Barbiturates NONE DETECTED  NONE DETECTED  CBC      Component Value Range   WBC 14.2 (*) 4.0 - 10.5 K/uL   RBC 4.72  4.22 - 5.81 MIL/uL   Hemoglobin 15.7  13.0 - 17.0 g/dL   HCT 16.1  09.6 - 04.5 %   MCV 95.6  78.0 - 100.0 fL   MCH 33.3  26.0 - 34.0 pg   MCHC 34.8  30.0 - 36.0 g/dL   RDW 40.9  81.1 - 91.4 %   Platelets 263  150 - 400 K/uL  SALICYLATE LEVEL      Component Value Range   Salicylate Lvl  <2.0 (*) 2.8 - 20.0 mg/dL  DIFFERENTIAL      Component Value Range   Neutrophils Relative 35 (*) 43 - 77 %   Lymphocytes Relative 57 (*) 12 - 46 %   Monocytes Relative 6  3 - 12 %   Eosinophils Relative 2  0 - 5 %   Basophils Relative 0  0 - 1 %   Neutro Abs 5.0  1.7 - 7.7 K/uL   Lymphs Abs 8.0 (*) 0.7 - 4.0 K/uL   Monocytes Absolute 0.9  0.1 - 1.0 K/uL   Eosinophils Absolute 0.3  0.0 - 0.7 K/uL   Basophils Absolute 0.0  0.0 - 0.1 K/uL   WBC Morphology ABSOLUTE LYMPHOCYTOSIS    VALPROIC ACID LEVEL      Component Value Range   Valproic Acid Lvl 20.7 (*) 50.0 - 100.0 ug/mL   6:37 AM ACT will evaluate patient when his sobriety has returned.           Hanley Seamen, MD 01/05/12 [phone]

## 2012-03-14 ENCOUNTER — Emergency Department (HOSPITAL_COMMUNITY)
Admission: EM | Admit: 2012-03-14 | Discharge: 2012-03-14 | Disposition: A | Discharge status: Home / Self Care | Payer: Medicare Other | Attending: Emergency Medicine | Admitting: Emergency Medicine

## 2012-03-14 DIAGNOSIS — F341 Dysthymic disorder: Secondary | ICD-10-CM | POA: Insufficient documentation

## 2012-03-14 DIAGNOSIS — F17200 Nicotine dependence, unspecified, uncomplicated: Secondary | ICD-10-CM | POA: Insufficient documentation

## 2012-03-14 DIAGNOSIS — G40919 Epilepsy, unspecified, intractable, without status epilepticus: Secondary | ICD-10-CM | POA: Insufficient documentation

## 2012-03-14 LAB — CBC WITH DIFFERENTIAL/PLATELET
Basophils Absolute: 0 10*3/uL (ref 0.0–0.1)
HCT: 44 % (ref 39.0–52.0)
Hemoglobin: 15.5 g/dL (ref 13.0–17.0)
Lymphocytes Relative: 14 % (ref 12–46)
Monocytes Absolute: 1.8 10*3/uL — ABNORMAL HIGH (ref 0.1–1.0)
Monocytes Relative: 7 % (ref 3–12)
Neutro Abs: 19.2 10*3/uL — ABNORMAL HIGH (ref 1.7–7.7)
Neutrophils Relative %: 79 % — ABNORMAL HIGH (ref 43–77)
WBC: 24.5 10*3/uL — ABNORMAL HIGH (ref 4.0–10.5)

## 2012-03-14 LAB — COMPREHENSIVE METABOLIC PANEL
AST: 24 U/L (ref 0–37)
Alkaline Phosphatase: 67 U/L (ref 39–117)
BUN: 8 mg/dL (ref 6–23)
CO2: 27 mEq/L (ref 19–32)
Chloride: 104 mEq/L (ref 96–112)
Creatinine, Ser: 0.33 mg/dL — ABNORMAL LOW (ref 0.50–1.35)
GFR calc non Af Amer: >90 mL/min (ref >90)
Potassium: 3.4 mEq/L — ABNORMAL LOW (ref 3.5–5.1)
Total Bilirubin: 0.4 mg/dL (ref 0.3–1.2)

## 2012-03-14 MED ORDER — DIVALPROEX SODIUM 250 MG PO DR TAB
500.0000 mg | DELAYED_RELEASE_TABLET | Freq: Two times a day (BID) | ORAL | Status: DC
  Administered 2012-03-14: 500 mg via ORAL
  Filled 2012-03-14: qty 2

## 2012-03-14 NOTE — ED Notes (Signed)
Pt here for seizure while on bus, pt did not take depakote this am, Postictal with ems.

## 2012-03-14 NOTE — ED Provider Notes (Addendum)
History     CSN: [id number]  Arrival date & time 03/14/12  1554   First MD Initiated Contact with Patient 03/14/12 1802      Chief Complaint  Patient presents with  . Seizures    (Consider location/radiation/quality/duration/timing/severity/associated sxs/prior treatment) HPI Comments: History of seizure disorder.  Forgot to take depakote this morning, then went out into the heat.  He then experienced a seizure that consisted of generalized shaking and loc.  No bowel or bladder incontinence and feels better now.    Patient is a 31 y.o. male presenting with seizures. The history is provided by the patient.  Seizures  This is a recurrent problem. The current episode started 1 to 2 hours ago. The problem has been resolved. There was 1 seizure. The most recent episode lasted 2 to 5 minutes. Characteristics include rhythmic jerking and loss of consciousness. The episode was witnessed. There was no sensation of an aura present. The seizures continued in the ED. There has been no fever.    Past Medical History  Diagnosis Date  . Seizures   . Arthrogryposis     wheel chair bound  . Mental disorder   . Depression   . Anxiety     Past Surgical History  Procedure Date  . Knee tendonotomy     as a child    No family history on file.  History  Substance Use Topics  . Smoking status: Current Some Day Smoker    Types: Cigarettes  . Smokeless tobacco: Never Used  . Alcohol Use: Yes     occasionally      Review of Systems  Neurological: Positive for seizures and loss of consciousness.  All other systems reviewed and are negative.    Allergies  Amoxicillin and Morphine and related  Home Medications   Current Outpatient Rx  Name Route Sig Dispense Refill  . CALCIUM CARBONATE ANTACID 500 MG PO CHEW Oral Chew 1 tablet by mouth daily.    Marland Kitchen CLONAZEPAM 1 MG PO TABS Oral Take 0.5 tablets (0.5 mg total) by mouth 2 (two) times daily as needed. 1 tablet 0  . VALPROIC ACID 250 MG  PO CAPS Oral Take 1-2 capsules (250-500 mg total) by mouth 2 (two) times daily. 1 capsule in the morning and 2 at night 30 capsule 0    BP 114/59  Pulse 77  Temp 98.4 F (36.9 C) (Oral)  Resp 15  SpO2 99%  Physical Exam  Nursing note and vitals reviewed. Constitutional: He is oriented to person, place, and time. He appears well-developed and well-nourished. No distress.  HENT:  Head: Normocephalic and atraumatic.  Eyes: EOM are normal. Pupils are equal, round, and reactive to light.  Neck: Normal range of motion. Neck supple.  Cardiovascular: Normal rate and regular rhythm.   No murmur heard. Pulmonary/Chest: Effort normal and breath sounds normal. No respiratory distress. He has no wheezes.  Abdominal: Soft. Bowel sounds are normal. He exhibits no distension. There is no tenderness.  Musculoskeletal: Normal range of motion.  Neurological: He is alert and oriented to person, place, and time. No cranial nerve deficit. Coordination normal.  Skin: Skin is warm and dry. He is not diaphoretic.    ED Course  Procedures (including critical care time)   Labs Reviewed  CBC WITH DIFFERENTIAL  COMPREHENSIVE METABOLIC PANEL  VALPROIC ACID LEVEL   No results found.   No diagnosis found.   Date: 04/12/2012  Rate: 77  Rhythm: normal sinus rhythm  QRS Axis:  normal  Intervals: normal  ST/T Wave abnormalities: normal  Conduction Disutrbances:none  Narrative Interpretation:   Old EKG Reviewed: none available    MDM  The labs are unremarkable with a subtherapeutic depakote level.  He will be discharged to home with the instructions to resume his home medications, return prn.  No further seizure activity in the ED.        Geoffery Lyons, MD 03/14/12 0981  Geoffery Lyons, MD 04/12/12 [phone]

## 2012-10-21 ENCOUNTER — Emergency Department (HOSPITAL_COMMUNITY)
Admission: EM | Admit: 2012-10-21 | Discharge: 2012-10-21 | Disposition: A | Discharge status: Home / Self Care | Payer: Medicare Other | Attending: Emergency Medicine | Admitting: Emergency Medicine

## 2012-10-21 ENCOUNTER — Encounter (HOSPITAL_COMMUNITY): Payer: Self-pay | Admitting: *Deleted

## 2012-10-21 DIAGNOSIS — F411 Generalized anxiety disorder: Secondary | ICD-10-CM | POA: Insufficient documentation

## 2012-10-21 DIAGNOSIS — Z79899 Other long term (current) drug therapy: Secondary | ICD-10-CM | POA: Insufficient documentation

## 2012-10-21 DIAGNOSIS — G40309 Generalized idiopathic epilepsy and epileptic syndromes, not intractable, without status epilepticus: Secondary | ICD-10-CM | POA: Insufficient documentation

## 2012-10-21 DIAGNOSIS — Z8739 Personal history of other diseases of the musculoskeletal system and connective tissue: Secondary | ICD-10-CM | POA: Insufficient documentation

## 2012-10-21 DIAGNOSIS — Z993 Dependence on wheelchair: Secondary | ICD-10-CM | POA: Insufficient documentation

## 2012-10-21 DIAGNOSIS — R5381 Other malaise: Secondary | ICD-10-CM | POA: Insufficient documentation

## 2012-10-21 DIAGNOSIS — Z87898 Personal history of other specified conditions: Secondary | ICD-10-CM

## 2012-10-21 DIAGNOSIS — R569 Unspecified convulsions: Secondary | ICD-10-CM

## 2012-10-21 DIAGNOSIS — F17200 Nicotine dependence, unspecified, uncomplicated: Secondary | ICD-10-CM | POA: Insufficient documentation

## 2012-10-21 DIAGNOSIS — F3289 Other specified depressive episodes: Secondary | ICD-10-CM | POA: Insufficient documentation

## 2012-10-21 DIAGNOSIS — F329 Major depressive disorder, single episode, unspecified: Secondary | ICD-10-CM | POA: Insufficient documentation

## 2012-10-21 DIAGNOSIS — R5383 Other fatigue: Secondary | ICD-10-CM | POA: Insufficient documentation

## 2012-10-21 LAB — COMPREHENSIVE METABOLIC PANEL
ALT: 14 U/L (ref 0–53)
CO2: 29 mEq/L (ref 19–32)
Calcium: 9.3 mg/dL (ref 8.4–10.5)
Creatinine, Ser: 0.35 mg/dL — ABNORMAL LOW (ref 0.50–1.35)
GFR calc Af Amer: >90 mL/min (ref >90)
GFR calc non Af Amer: >90 mL/min (ref >90)
Glucose, Bld: 91 mg/dL (ref 70–99)
Sodium: 143 mEq/L (ref 135–145)
Total Protein: 7 g/dL (ref 6.0–8.3)

## 2012-10-21 LAB — VALPROIC ACID LEVEL: Valproic Acid Lvl: 80.5 ug/mL (ref 50.0–100.0)

## 2012-10-21 LAB — URINALYSIS, ROUTINE W REFLEX MICROSCOPIC
Bilirubin Urine: NEGATIVE
Glucose, UA: NEGATIVE mg/dL
Hgb urine dipstick: NEGATIVE
Specific Gravity, Urine: 1.023 (ref 1.005–1.030)
Urobilinogen, UA: 1 mg/dL (ref 0.0–1.0)

## 2012-10-21 MED ORDER — SODIUM CHLORIDE 0.9 % IV BOLUS (SEPSIS)
500.0000 mL | Freq: Once | INTRAVENOUS | Status: AC
  Administered 2012-10-21: 500 mL via INTRAVENOUS

## 2012-10-21 MED ORDER — LORAZEPAM 1 MG PO TABS
1.0000 mg | ORAL_TABLET | Freq: Once | ORAL | Status: AC
  Administered 2012-10-21: 1 mg via ORAL
  Filled 2012-10-21: qty 1

## 2012-10-21 NOTE — ED Notes (Signed)
No distress noted.  Attempting to provide urine sample.

## 2012-10-21 NOTE — ED Notes (Signed)
No distress noted.  Resp symmetrical and unlabored.  Skin warm and dry.  Denies pain.   

## 2012-10-21 NOTE — ED Notes (Signed)
Patient unable to urinate for urine sample, will call when able

## 2012-10-21 NOTE — ED Provider Notes (Signed)
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 He states that he has had seizures his whole life, he has taken different medications but currently takes Depakote and denies missing any doses. He states that he did not sleep last night, he felt restless and on easy and felt extremely tired this morning. While he was talking to a friend, while he was sitting down, he developed acute onset of seizure which was described as tonic-clonic of the bilateral upper extremities and the head with the eyes deviated, nonverbal and foaming at the mouth. The friend who accompanies the patient to the hospital states this lasted approximately 10 minutes though he states he is unsure exactly when it started because he came when the seizure was getting close standing. The friend who saw the seizure started is not present and I have been unable to get a hold of him. When the seizure broke, the patient was postictal with confusion, disorientation and severe fatigue. He has no complaints of tongue biting or urinary incontinence and at this time on arrival to the hospital feels like he is back to his baseline other than feeling generally tired. He does drink occasional alcohol but not daily and has never had withdrawal seizures, he has no rashes fevers chills nausea vomiting diarrhea abdominal pain chest pain back pain coughing or shortness of breath.  On exam the patient is a soft abdomen, clear heart and lung sounds, neurologic exam is at baseline with cranial nerves III through XII normal, baseline use of the upper extremities is normal given his congenital deformities, able to move his bilateral lower extremities appropriately and his speech is normal. He is alert and oriented and follows commands without difficulty. His memory is intact other than the period to the seizure which she does not remember.  Will check Depakote level, compresses  metabolic panel, urinalysis and give Ativan. At this time the patient appears not to be seizing and appears stable with reassuring vital signs. There is no indication for CT scan of the head at this time.  No further seizures, labs unremarkable, pt tolerating PO and at baseline, can f/u for further management, possible related to decreased sleep over last  Night as pt admits to same.  Stable for d/c.  Filed Vitals:   10/21/12 1535  BP: 105/69  Pulse: 61  Temp: 97.4 F (36.3 C)  Resp: 18   Results for orders placed during the hospital encounter of 10/21/12  COMPREHENSIVE METABOLIC PANEL      Result Value Range   Sodium 143  135 - 145 mEq/L   Potassium 3.6  3.5 - 5.1 mEq/L   Chloride 104  96 - 112 mEq/L   CO2 29  19 - 32 mEq/L   Glucose, Bld 91  70 - 99 mg/dL   BUN 8  6 - 23 mg/dL   Creatinine, Ser 1.61 (*) 0.50 - 1.35 mg/dL   Calcium 9.3  8.4 - 09.6 mg/dL   Total Protein 7.0  6.0 - 8.3 g/dL   Albumin 4.0  3.5 - 5.2 g/dL   AST 22  0 - 37 U/L   ALT 14  0 - 53 U/L   Alkaline Phosphatase 68  39 - 117 U/L   Total Bilirubin 0.2 (*) 0.3 - 1.2 mg/dL   GFR calc non Af Amer >90  >90 mL/min   GFR calc Af Amer >90  >90 mL/min  VALPROIC ACID LEVEL  Result Value Range   Valproic Acid Lvl 80.5  50.0 - 100.0 ug/mL  URINALYSIS, ROUTINE W REFLEX MICROSCOPIC      Result Value Range   Color, Urine YELLOW  YELLOW   APPearance CLEAR  CLEAR   Specific Gravity, Urine 1.023  1.005 - 1.030   pH 7.5  5.0 - 8.0   Glucose, UA NEGATIVE  NEGATIVE mg/dL   Hgb urine dipstick NEGATIVE  NEGATIVE   Bilirubin Urine NEGATIVE  NEGATIVE   Ketones, ur 15 (*) NEGATIVE mg/dL   Protein, ur NEGATIVE  NEGATIVE mg/dL   Urobilinogen, UA 1.0  0.0 - 1.0 mg/dL   Nitrite NEGATIVE  NEGATIVE   Leukocytes, UA NEGATIVE  NEGATIVE   No results found.    Medical screening examination/treatment/procedure(s) were conducted as a shared visit with non-physician practitioner(s) and myself.  I personally evaluated the  patient during the encounter    Vida Roller, MD 10/22/12 [phone]

## 2012-10-21 NOTE — ED Notes (Signed)
The pts cbg was 98 per ems

## 2012-10-21 NOTE — ED Notes (Signed)
The pt arrived by gems from home where he was found by the paramedics to be postictal.  Someone at the house  Described a generalized seizure that lasted approx 25 minutes.  The pt has a history of the same.  He is presently alert remembering some things and not others.  No tongue damage no  Loss of control of bowel or bladder.  The pt does not remember when he had his last seizure.  The male with the pt has known him for only 2 weeks and hangs out with him because he does not have a job.  No iv  The pt reports that he has taken his meds regularly.

## 2012-10-21 NOTE — ED Provider Notes (Signed)
History     CSN: [id number]  Arrival date & time 10/21/12  1514   First MD Initiated Contact with Patient 10/21/12 1538      Chief Complaint  Patient presents with  . Seizures    (Consider location/radiation/quality/duration/timing/severity/associated sxs/prior treatment) HPI Comments: Patient is a 32 y/o M with PMHx of seizures, depression, anxiety, arthrogryposis presenting to ED after seizure episode - patient presented with neighbor who was at the scene of the event. Patient reported that seizure episode started approximately 1 hour ago, lasting for approximately 20-25 minutes. Patient reported aura prior to episode - stated that he just "did not feel right." Neighbor reported that episode of seizure consisted of rhythmic head jerking motions with loss of consciousness and episode of vomiting - patient confirmed that these are the normal symptoms that occur during seizure episodes. Patient reported feeling fatigued and confused in postictal phase - neighbor reported that patient did not know where he was. Patient reported feeling a lot better, but still remains tired. Patient reported taking his depakote this morning at 8:30am, denied taking klonopin today. Patient denied fever, chils, urinary incontinence, shortness of breathe, difficulty breathing, chest pain, abdominal pain, numbness to extremities, paresthesias, facial drooping, speech difficulties, visual distortions, head trauma.   Patient reported occasional alcohol use, marijuana use - last use was last night, daily cigarette smoker.  Patient reported that he has not had a seizure for a couple of months.  Patient has history of seizures, patient seen on 03/14/2012 for similar complaint.  Patient is non-ambulatory - uses electrical wheelchair to get around.   Patient is a 32 y.o. male presenting with seizures. The history is provided by the patient and a friend. No language interpreter was used.  Seizures Seizure activity on  arrival: no   Seizure type:  Myoclonic and focal Preceding symptoms: aura   Preceding symptoms: no dizziness, no euphoria, no headache, no nausea, no numbness and no vision change   Initial focality: head. Episode characteristics: focal shaking   Episode characteristics: no stiffening and no tongue biting   Postictal symptoms: confusion and somnolence   Return to baseline: yes   Severity:  Moderate Duration:  20 minutes Timing:  Once Number of seizures this episode:  1 Progression:  Resolved Recent head injury:  No recent head injuries History of seizures: yes     Past Medical History  Diagnosis Date  . Seizures   . Arthrogryposis     wheel chair bound  . Mental disorder   . Depression   . Anxiety     Past Surgical History  Procedure Laterality Date  . Knee tendonotomy      as a child    No family history on file.  History  Substance Use Topics  . Smoking status: Current Some Day Smoker    Types: Cigarettes  . Smokeless tobacco: Never Used  . Alcohol Use: Yes     Comment: occasionally      Review of Systems  Constitutional: Positive for fatigue. Negative for fever and chills.  HENT: Negative for ear pain, trouble swallowing, neck pain, neck stiffness and tinnitus.   Eyes: Negative for photophobia, pain and visual disturbance.  Respiratory: Negative for chest tightness and shortness of breath.   Cardiovascular: Negative for chest pain.  Gastrointestinal: Negative for nausea, vomiting, abdominal pain and diarrhea.  Musculoskeletal: Negative for myalgias.  Neurological: Positive for seizures. Negative for dizziness, speech difficulty and headaches.  Psychiatric/Behavioral: Negative for confusion.  All other systems reviewed and  are negative.    Allergies  Amoxicillin and Morphine and related  Home Medications   Current Outpatient Rx  Name  Route  Sig  Dispense  Refill  . clonazePAM (KLONOPIN) 1 MG tablet   Oral   Take 0.5 mg by mouth 2 (two) times  daily as needed for anxiety.         . valproic acid (DEPAKENE) 250 MG capsule   Oral   Take 1-2 capsules (250-500 mg total) by mouth 2 (two) times daily. 1 capsule in the morning and 2 at night   30 capsule   0     BP 106/67  Pulse 71  Temp(Src) 97.4 F (36.3 C) (Oral)  Resp 16  SpO2 100%  Physical Exam  Nursing note and vitals reviewed. Constitutional: He is oriented to person, place, and time. He appears well-developed and well-nourished. No distress.  HENT:  Head: Normocephalic and atraumatic.  Mouth/Throat: Oropharynx is clear and moist. No oropharyngeal exudate.  Mouth: Buccal mucosa moist. No lesions, masses, sores noted to gum or buccal mucosa. No lesions, sores to tongue.   Eyes: Conjunctivae and EOM are normal. Pupils are equal, round, and reactive to light. Right eye exhibits no discharge. Left eye exhibits no discharge.  Neck: Normal range of motion. Neck supple. No tracheal deviation present.  Cardiovascular: Normal rate, regular rhythm, normal heart sounds and intact distal pulses.  Exam reveals no friction rub.   No murmur heard. Pulmonary/Chest: Effort normal and breath sounds normal. He has no wheezes. He has no rales.  Abdominal: Soft. Bowel sounds are normal. He exhibits no distension. There is no tenderness. There is no rebound.  Musculoskeletal: Normal range of motion. He exhibits no edema and no tenderness.  5+/5+ upper and lower extremities.   Lymphadenopathy:    He has no cervical adenopathy.  Neurological: He is alert and oriented to person, place, and time. No cranial nerve deficit. He exhibits normal muscle tone. Coordination normal.  Follows commands. Comprehension and understanding intact. Knows who he is, where he is, the situation that is going on - completely intuitive to situation. Cranial nerves III-XII intact.  Coordination, motion, and tone normal.     Skin: Skin is warm and dry. No rash noted. He is not diaphoretic. No erythema.   Psychiatric: He has a normal mood and affect. His behavior is normal. Thought content normal.  Patient calm and cooperating.     ED Course  Procedures (including critical care time)  Labs Reviewed  COMPREHENSIVE METABOLIC PANEL - Abnormal; Notable for the following:    Creatinine, Ser 0.35 (*)    Total Bilirubin 0.2 (*)    All other components within normal limits  URINALYSIS, ROUTINE W REFLEX MICROSCOPIC - Abnormal; Notable for the following:    Ketones, ur 15 (*)    All other components within normal limits  VALPROIC ACID LEVEL   Results for orders placed during the hospital encounter of 10/21/12  COMPREHENSIVE METABOLIC PANEL      Result Value Range   Sodium 143  135 - 145 mEq/L   Potassium 3.6  3.5 - 5.1 mEq/L   Chloride 104  96 - 112 mEq/L   CO2 29  19 - 32 mEq/L   Glucose, Bld 91  70 - 99 mg/dL   BUN 8  6 - 23 mg/dL   Creatinine, Ser 1.61 (*) 0.50 - 1.35 mg/dL   Calcium 9.3  8.4 - 09.6 mg/dL   Total Protein 7.0  6.0 - 8.3  g/dL   Albumin 4.0  3.5 - 5.2 g/dL   AST 22  0 - 37 U/L   ALT 14  0 - 53 U/L   Alkaline Phosphatase 68  39 - 117 U/L   Total Bilirubin 0.2 (*) 0.3 - 1.2 mg/dL   GFR calc non Af Amer >90  >90 mL/min   GFR calc Af Amer >90  >90 mL/min  VALPROIC ACID LEVEL      Result Value Range   Valproic Acid Lvl 80.5  50.0 - 100.0 ug/mL  URINALYSIS, ROUTINE W REFLEX MICROSCOPIC      Result Value Range   Color, Urine YELLOW  YELLOW   APPearance CLEAR  CLEAR   Specific Gravity, Urine 1.023  1.005 - 1.030   pH 7.5  5.0 - 8.0   Glucose, UA NEGATIVE  NEGATIVE mg/dL   Hgb urine dipstick NEGATIVE  NEGATIVE   Bilirubin Urine NEGATIVE  NEGATIVE   Ketones, ur 15 (*) NEGATIVE mg/dL   Protein, ur NEGATIVE  NEGATIVE mg/dL   Urobilinogen, UA 1.0  0.0 - 1.0 mg/dL   Nitrite NEGATIVE  NEGATIVE   Leukocytes, UA NEGATIVE  NEGATIVE   No results found.    No results found. Filed Vitals:   10/21/12 1930  BP: 106/67  Pulse: 71  Temp:   Resp: 16     1. Seizure    2. History of seizures       MDM  Patient presenting to ED after postictal phase. I personally evaluated and examined the patient - patient not seizing at time of evaluation, appears stable. Patient afebrile, normotensive, non-tachycardic, calm affect. Patient alert and oriented x 3, cranial nerves III-XII intact without deficit, strength to upper and lower extremities present with full ROM. No trauma noted to head. Patient mainly presenting with fatigue after the event.  Ativan given, 500 mL bolus normal saline given. CMP negative findings. UA mild ketones - dehydration. Depakote level within normal limits. Patient appeared well, functioning, cooperative, understandable, with no complaints on re-evaluation. Discharged patient - aseptic and not-toxic appearing. Discussed with patient to follow-up with physician tomorrow for re-check and consultation about medications and condition. Discussed with patient to continue taking home medications as prescribed. Discussed with patient to continue to hydrate. Discussed with patient that if symptoms are to worsen to please report back to the ED. Patient agreed to plan of care, understood, all questions answered.          [employer], PA-C 10/22/12 0001

## 2012-10-22 NOTE — ED Provider Notes (Signed)
Medical screening examination/treatment/procedure(s) were conducted as a shared visit with non-physician practitioner(s) and myself.  I personally evaluated the patient during the encounter  Please see my separate respective documentation pertaining to this patient encounter   Vida Roller, MD 10/22/12 [phone]

## 2013-05-29 ENCOUNTER — Emergency Department (HOSPITAL_COMMUNITY)
Admission: EM | Admit: 2013-05-29 | Discharge: 2013-05-29 | Disposition: A | Discharge status: Home / Self Care | Payer: Medicare Other | Attending: Emergency Medicine | Admitting: Emergency Medicine

## 2013-05-29 ENCOUNTER — Encounter (HOSPITAL_COMMUNITY): Payer: Self-pay | Admitting: Emergency Medicine

## 2013-05-29 DIAGNOSIS — Z8739 Personal history of other diseases of the musculoskeletal system and connective tissue: Secondary | ICD-10-CM | POA: Insufficient documentation

## 2013-05-29 DIAGNOSIS — F17200 Nicotine dependence, unspecified, uncomplicated: Secondary | ICD-10-CM | POA: Insufficient documentation

## 2013-05-29 DIAGNOSIS — Z79899 Other long term (current) drug therapy: Secondary | ICD-10-CM | POA: Insufficient documentation

## 2013-05-29 DIAGNOSIS — G40919 Epilepsy, unspecified, intractable, without status epilepticus: Secondary | ICD-10-CM

## 2013-05-29 DIAGNOSIS — F411 Generalized anxiety disorder: Secondary | ICD-10-CM | POA: Insufficient documentation

## 2013-05-29 DIAGNOSIS — G40909 Epilepsy, unspecified, not intractable, without status epilepticus: Secondary | ICD-10-CM | POA: Insufficient documentation

## 2013-05-29 MED ORDER — OXYCODONE-ACETAMINOPHEN 5-325 MG PO TABS
1.0000 | ORAL_TABLET | Freq: Once | ORAL | Status: AC
  Administered 2013-05-29: 1 via ORAL
  Filled 2013-05-29: qty 1
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
## 2013-05-29 NOTE — ED Provider Notes (Signed)
CSN: [id number]     Arrival date & time 05/29/13  1916 History   First MD Initiated Contact with Patient 05/29/13 1918     Chief Complaint  Patient presents with  . Seizures   (Consider location/radiation/quality/duration/timing/severity/associated sxs/prior Treatment) Patient is a 32 y.o. male presenting with seizures. The history is provided by the patient. No language interpreter was used.  Seizures Seizure activity on arrival: no   Seizure type:  Focal Preceding symptoms: no sensation of an aura present, no dizziness and no headache   Initial focality:  None Episode characteristics: abnormal movements and generalized shaking   Episode characteristics: no apnea, no combativeness, no confusion, no disorientation, no incontinence, no limpness, fully responsive, no stiffening, no tongue biting and responsive   Postictal symptoms: no confusion   Return to baseline: yes   Severity:  Moderate Timing:  Once Number of seizures this episode:  1 Progression:  Resolved Context: not alcohol withdrawal, not cerebral palsy, not change in medication, not sleeping less, not drug use, not emotional upset, not fever and not stress   Recent head injury:  No recent head injuries PTA treatment:  None History of seizures: yes     Past Medical History  Diagnosis Date  . Seizures   . Arthrogryposis     wheel chair bound  . Mental disorder   . Depression   . Anxiety    Past Surgical History  Procedure Laterality Date  . Knee tendonotomy      as a child   History reviewed. No pertinent family history. History  Substance Use Topics  . Smoking status: Current Some Day Smoker    Types: Cigarettes  . Smokeless tobacco: Never Used  . Alcohol Use: Yes     Comment: occasionally    Review of Systems  Constitutional: Negative for fever and chills.  Respiratory: Negative for cough and shortness of breath.   Cardiovascular: Negative for chest pain.  Gastrointestinal: Negative for nausea,  vomiting, abdominal pain, diarrhea and constipation.  Genitourinary: Negative for dysuria, urgency and frequency.  Neurological: Positive for seizures. Negative for dizziness, weakness and numbness.  All other systems reviewed and are negative.    Allergies  Amoxicillin and Morphine and related  Home Medications   Current Outpatient Rx  Name  Route  Sig  Dispense  Refill  . clonazePAM (KLONOPIN) 1 MG tablet   Oral   Take 0.5 mg by mouth 2 (two) times daily as needed for anxiety.         . valproic acid (DEPAKENE) 250 MG capsule   Oral   Take 1-2 capsules (250-500 mg total) by mouth 2 (two) times daily. 1 capsule in the morning and 2 at night   30 capsule   0    BP 130/78  Pulse 98  Temp(Src) 98.2 F (36.8 C) (Oral)  Resp 18  SpO2 100% Physical Exam  Nursing note and vitals reviewed. Constitutional: He is oriented to person, place, and time. He appears well-developed and well-nourished. No distress.  HENT:  Head: Normocephalic and atraumatic.  Eyes: Pupils are equal, round, and reactive to light.  Neck: Normal range of motion.  Cardiovascular: Normal rate, regular rhythm and normal heart sounds.   Pulmonary/Chest: Effort normal and breath sounds normal. No respiratory distress. He has no decreased breath sounds. He has no wheezes. He has no rhonchi. He has no rales.  Abdominal: Soft. He exhibits no distension. There is no tenderness. There is no rebound and no guarding.  Musculoskeletal: He  exhibits no edema and no tenderness.  Neurological: He is alert and oriented to person, place, and time. He has normal strength. No cranial nerve deficit or sensory deficit. He exhibits normal muscle tone.  Back to baseline mental status per family.    Skin: Skin is warm and dry.    ED Course  Procedures (including critical care time) Labs Review Labs Reviewed - No data to display Imaging Review No results found.  EKG Interpretation   None       MDM  Patient is a  32 year old Caucasian male past medical history of seizures who comes emergency Department after breakthrough seizure. His previous breakthrough seizure was approximately a year ago. Has not missed any medications. Physical exam as above. Patient is currently back to his baseline. Has not had a fevers, chills, cough, dysuria, or falls. Result was not felt to require further evaluation at this time.  He was observed in the emergency department for approximately an hour. He had no repeat seizures. Remainder alert and oriented. Patient was felt stable for discharge. He was instructed to continue taking his medications ordered. He was instructed to followup with his neurologist in a week to evaluate for breakthrough seizures. He was instructed to return to emergency department if he develops repeat seizures, fevers, or any other concerns. Patient expressed understanding. He was discharged in stable condition. Care was discussed with my attending Dr. Jeraldine Loots.    1. Breakthrough seizure       Bethann Berkshire, MD 05/29/13 [phone]

## 2013-05-29 NOTE — ED Notes (Signed)
Pt states he was going home in his wheelchair when he began having seizure. Post ictal vitals WNL, patient only complaint is a severe headache. Denies missing meds, last seizure March.

## 2013-05-30 NOTE — ED Provider Notes (Signed)
  I have seen the patient with the resident physician, Dr. Craige Cotta.  The documentation is an accurate reflection of the patient's ED presentation, with the following additions:  On my exam the patient recovered from his postictal phase, was awake and alert, appropriately interactive.  I had a lengthy discussion with him and his family member about the need for ongoing appropriate medication use, primary care followup.  Absent distress he was appropriate for outpatient followup.  Gerhard Munch, MD 05/30/13 [phone]

## 2014-01-26 ENCOUNTER — Encounter (HOSPITAL_COMMUNITY): Payer: Self-pay | Admitting: Emergency Medicine

## 2014-01-26 ENCOUNTER — Inpatient Hospital Stay (HOSPITAL_COMMUNITY)
Admission: EM | Admit: 2014-01-26 | Discharge: 2014-02-03 | DRG: 501 | Disposition: A | Discharge status: Skilled Nursing Facility | Payer: No Typology Code available for payment source | Attending: General Surgery | Admitting: General Surgery

## 2014-01-26 DIAGNOSIS — F17200 Nicotine dependence, unspecified, uncomplicated: Secondary | ICD-10-CM | POA: Diagnosis present

## 2014-01-26 DIAGNOSIS — S53005A Unspecified dislocation of left radial head, initial encounter: Secondary | ICD-10-CM

## 2014-01-26 DIAGNOSIS — S060XAA Concussion with loss of consciousness status unknown, initial encounter: Secondary | ICD-10-CM

## 2014-01-26 DIAGNOSIS — D62 Acute posthemorrhagic anemia: Secondary | ICD-10-CM | POA: Diagnosis not present

## 2014-01-26 DIAGNOSIS — S060X1A Concussion with loss of consciousness of 30 minutes or less, initial encounter: Secondary | ICD-10-CM

## 2014-01-26 DIAGNOSIS — F1010 Alcohol abuse, uncomplicated: Secondary | ICD-10-CM | POA: Diagnosis present

## 2014-01-26 DIAGNOSIS — T79A0XA Compartment syndrome, unspecified, initial encounter: Secondary | ICD-10-CM

## 2014-01-26 DIAGNOSIS — S0181XA Laceration without foreign body of other part of head, initial encounter: Secondary | ICD-10-CM

## 2014-01-26 DIAGNOSIS — S32599A Other specified fracture of unspecified pubis, initial encounter for closed fracture: Secondary | ICD-10-CM

## 2014-01-26 DIAGNOSIS — T798XXA Other early complications of trauma, initial encounter: Secondary | ICD-10-CM | POA: Diagnosis present

## 2014-01-26 DIAGNOSIS — S82401A Unspecified fracture of shaft of right fibula, initial encounter for closed fracture: Secondary | ICD-10-CM

## 2014-01-26 DIAGNOSIS — T79A21A Traumatic compartment syndrome of right lower extremity, initial encounter: Secondary | ICD-10-CM

## 2014-01-26 DIAGNOSIS — Q688 Other specified congenital musculoskeletal deformities: Secondary | ICD-10-CM

## 2014-01-26 DIAGNOSIS — S82201A Unspecified fracture of shaft of right tibia, initial encounter for closed fracture: Secondary | ICD-10-CM

## 2014-01-26 DIAGNOSIS — T07XXXA Unspecified multiple injuries, initial encounter: Secondary | ICD-10-CM | POA: Diagnosis present

## 2014-01-26 DIAGNOSIS — S060X9A Concussion with loss of consciousness of unspecified duration, initial encounter: Secondary | ICD-10-CM

## 2014-01-26 DIAGNOSIS — F3289 Other specified depressive episodes: Secondary | ICD-10-CM | POA: Diagnosis present

## 2014-01-26 DIAGNOSIS — F1210 Cannabis abuse, uncomplicated: Secondary | ICD-10-CM | POA: Diagnosis present

## 2014-01-26 DIAGNOSIS — S82899A Other fracture of unspecified lower leg, initial encounter for closed fracture: Principal | ICD-10-CM | POA: Diagnosis present

## 2014-01-26 DIAGNOSIS — F329 Major depressive disorder, single episode, unspecified: Secondary | ICD-10-CM | POA: Diagnosis present

## 2014-01-26 DIAGNOSIS — S32509A Unspecified fracture of unspecified pubis, initial encounter for closed fracture: Secondary | ICD-10-CM | POA: Diagnosis present

## 2014-01-26 DIAGNOSIS — IMO0002 Reserved for concepts with insufficient information to code with codable children: Secondary | ICD-10-CM | POA: Diagnosis present

## 2014-01-26 DIAGNOSIS — F411 Generalized anxiety disorder: Secondary | ICD-10-CM | POA: Diagnosis present

## 2014-01-26 DIAGNOSIS — Z993 Dependence on wheelchair: Secondary | ICD-10-CM

## 2014-01-26 DIAGNOSIS — M6289 Other specified disorders of muscle: Secondary | ICD-10-CM | POA: Diagnosis present

## 2014-01-26 DIAGNOSIS — S0180XA Unspecified open wound of other part of head, initial encounter: Secondary | ICD-10-CM | POA: Diagnosis present

## 2014-01-26 HISTORY — DX: Other generalized epilepsy and epileptic syndromes, not intractable, without status epilepticus: G40.409

## 2014-01-26 HISTORY — DX: Pneumonia, unspecified organism: J18.9

## 2014-01-26 HISTORY — PX: OTHER SURGICAL HISTORY: SHX169

## 2014-01-26 HISTORY — DX: Gastro-esophageal reflux disease without esophagitis: K21.9

## 2014-01-26 MED ORDER — MORPHINE SULFATE 4 MG/ML IJ SOLN
4.0000 mg | Freq: Once | INTRAMUSCULAR | Status: DC
  Filled 2014-01-26: qty 1

## 2014-01-26 MED ORDER — TETANUS-DIPHTH-ACELL PERTUSSIS 5-2.5-18.5 LF-MCG/0.5 IM SUSP
0.5000 mL | Freq: Once | INTRAMUSCULAR | Status: AC
  Administered 2014-01-26: 0.5 mL via INTRAMUSCULAR
  Filled 2014-01-26: qty 0.5

## 2014-01-26 MED ORDER — ONDANSETRON 4 MG PO TBDP
8.0000 mg | ORAL_TABLET | Freq: Once | ORAL | Status: AC
  Administered 2014-01-27: 8 mg via ORAL
  Filled 2014-01-26: qty 2

## 2014-01-26 MED ORDER — OXYCODONE-ACETAMINOPHEN 5-325 MG PO TABS
1.0000 | ORAL_TABLET | Freq: Once | ORAL | Status: AC
  Administered 2014-01-27: 1 via ORAL
  Filled 2014-01-26: qty 1
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
## 2014-01-27 ENCOUNTER — Emergency Department (HOSPITAL_COMMUNITY): Payer: No Typology Code available for payment source

## 2014-01-27 ENCOUNTER — Inpatient Hospital Stay (HOSPITAL_COMMUNITY): Payer: No Typology Code available for payment source

## 2014-01-27 ENCOUNTER — Encounter (HOSPITAL_COMMUNITY): Admission: EM | Disposition: A | Discharge status: Skilled Nursing Facility | Payer: Self-pay | Source: Home / Self Care

## 2014-01-27 ENCOUNTER — Emergency Department (HOSPITAL_COMMUNITY): Payer: No Typology Code available for payment source | Admitting: Certified Registered Nurse Anesthetist

## 2014-01-27 ENCOUNTER — Encounter (HOSPITAL_COMMUNITY): Payer: No Typology Code available for payment source | Admitting: Certified Registered Nurse Anesthetist

## 2014-01-27 ENCOUNTER — Encounter (HOSPITAL_COMMUNITY): Payer: Self-pay | Admitting: Radiology

## 2014-01-27 DIAGNOSIS — IMO0002 Reserved for concepts with insufficient information to code with codable children: Secondary | ICD-10-CM

## 2014-01-27 DIAGNOSIS — S32509A Unspecified fracture of unspecified pubis, initial encounter for closed fracture: Secondary | ICD-10-CM

## 2014-01-27 DIAGNOSIS — S060XAA Concussion with loss of consciousness status unknown, initial encounter: Secondary | ICD-10-CM

## 2014-01-27 DIAGNOSIS — S82401A Unspecified fracture of shaft of right fibula, initial encounter for closed fracture: Secondary | ICD-10-CM

## 2014-01-27 DIAGNOSIS — S82209A Unspecified fracture of shaft of unspecified tibia, initial encounter for closed fracture: Secondary | ICD-10-CM

## 2014-01-27 DIAGNOSIS — S060X9A Concussion with loss of consciousness of unspecified duration, initial encounter: Secondary | ICD-10-CM

## 2014-01-27 DIAGNOSIS — S82409A Unspecified fracture of shaft of unspecified fibula, initial encounter for closed fracture: Secondary | ICD-10-CM

## 2014-01-27 DIAGNOSIS — S32599A Other specified fracture of unspecified pubis, initial encounter for closed fracture: Secondary | ICD-10-CM

## 2014-01-27 DIAGNOSIS — S82201A Unspecified fracture of shaft of right tibia, initial encounter for closed fracture: Secondary | ICD-10-CM

## 2014-01-27 HISTORY — PX: DORSAL COMPARTMENT RELEASE: SHX5039

## 2014-01-27 LAB — BASIC METABOLIC PANEL
ANION GAP: 19 — AB (ref 5–15)
ANION GAP: 16 — AB (ref 5–15)
BUN: 5 mg/dL — ABNORMAL LOW (ref 6–23)
BUN: 4 mg/dL — ABNORMAL LOW (ref 6–23)
CO2: 23 mEq/L (ref 19–32)
CO2: 24 mEq/L (ref 19–32)
Calcium: 8.6 mg/dL (ref 8.4–10.5)
Calcium: 8.6 mg/dL (ref 8.4–10.5)
Chloride: 98 mEq/L (ref 96–112)
Chloride: 99 mEq/L (ref 96–112)
Creatinine, Ser: 0.35 mg/dL — ABNORMAL LOW (ref 0.50–1.35)
Creatinine, Ser: 0.37 mg/dL — ABNORMAL LOW (ref 0.50–1.35)
GFR calc Af Amer: >90 mL/min (ref >90)
GFR calc non Af Amer: >90 mL/min (ref >90)
GFR calc non Af Amer: >90 mL/min (ref >90)
GLUCOSE: 93 mg/dL (ref 70–99)
Glucose, Bld: 141 mg/dL — ABNORMAL HIGH (ref 70–99)
POTASSIUM: 4.5 meq/L (ref 3.7–5.3)
Potassium: 3.6 mEq/L — ABNORMAL LOW (ref 3.7–5.3)
SODIUM: 139 meq/L (ref 137–147)
Sodium: 140 mEq/L (ref 137–147)

## 2014-01-27 LAB — TYPE AND SCREEN
ABO/RH(D): O POS
Antibody Screen: NEGATIVE

## 2014-01-27 LAB — CBC
HCT: 41.5 % (ref 39.0–52.0)
HEMOGLOBIN: 14 g/dL (ref 13.0–17.0)
MCH: 33.3 pg (ref 26.0–34.0)
MCHC: 33.7 g/dL (ref 30.0–36.0)
MCV: 98.8 fL (ref 78.0–100.0)
PLATELETS: 135 10*3/uL — AB (ref 150–400)
RBC: 4.2 MIL/uL — ABNORMAL LOW (ref 4.22–5.81)
RDW: 13.5 % (ref 11.5–15.5)
WBC: 15.2 10*3/uL — ABNORMAL HIGH (ref 4.0–10.5)

## 2014-01-27 LAB — ABO/RH: ABO/RH(D): O POS

## 2014-01-27 LAB — CBC WITH DIFFERENTIAL/PLATELET
BASOS ABS: 0 10*3/uL (ref 0.0–0.1)
BASOS PCT: 0 % (ref 0–1)
Eosinophils Absolute: 0 10*3/uL (ref 0.0–0.7)
Eosinophils Relative: 0 % (ref 0–5)
HCT: 41.9 % (ref 39.0–52.0)
HEMOGLOBIN: 14.5 g/dL (ref 13.0–17.0)
Lymphocytes Relative: 16 % (ref 12–46)
Lymphs Abs: 3.8 10*3/uL (ref 0.7–4.0)
MCH: 33.1 pg (ref 26.0–34.0)
MCHC: 34.6 g/dL (ref 30.0–36.0)
MCV: 95.7 fL (ref 78.0–100.0)
Monocytes Absolute: 2.4 10*3/uL — ABNORMAL HIGH (ref 0.1–1.0)
Monocytes Relative: 10 % (ref 3–12)
NEUTROS ABS: 17.8 10*3/uL — AB (ref 1.7–7.7)
NEUTROS PCT: 74 % (ref 43–77)
Platelets: 217 10*3/uL (ref 150–400)
RBC: 4.38 MIL/uL (ref 4.22–5.81)
RDW: 12.9 % (ref 11.5–15.5)
WBC: 23.9 10*3/uL — ABNORMAL HIGH (ref 4.0–10.5)

## 2014-01-27 LAB — CK: Total CK: 578 U/L — ABNORMAL HIGH (ref 7–232)

## 2014-01-27 SURGERY — RELEASE, FIRST DORSAL COMPARTMENT, HAND
Anesthesia: General | Site: Leg Lower | Laterality: Right

## 2014-01-27 MED ORDER — SODIUM CHLORIDE 0.9 % IJ SOLN: 9.0000 mL | INTRAMUSCULAR | Status: DC | PRN

## 2014-01-27 MED ORDER — PANTOPRAZOLE SODIUM 40 MG PO TBEC
40.0000 mg | DELAYED_RELEASE_TABLET | Freq: Every day | ORAL | Status: DC
  Administered 2014-01-27 – 2014-01-28 (×2): 40 mg via ORAL
  Filled 2014-01-27 (×2): qty 1

## 2014-01-27 MED ORDER — DIPHENHYDRAMINE HCL 50 MG/ML IJ SOLN
12.5000 mg | Freq: Four times a day (QID) | INTRAMUSCULAR | Status: DC | PRN
  Administered 2014-01-27 – 2014-01-28 (×2): 12.5 mg via INTRAVENOUS
  Filled 2014-01-27: qty 1

## 2014-01-27 MED ORDER — DIPHENHYDRAMINE HCL 12.5 MG/5ML PO ELIX: 12.5000 mg | ORAL_SOLUTION | Freq: Four times a day (QID) | ORAL | Status: DC | PRN

## 2014-01-27 MED ORDER — ONDANSETRON HCL 4 MG/2ML IJ SOLN: 4.0000 mg | Freq: Four times a day (QID) | INTRAMUSCULAR | Status: DC | PRN

## 2014-01-27 MED ORDER — CLINDAMYCIN PHOSPHATE 600 MG/50ML IV SOLN
600.0000 mg | Freq: Three times a day (TID) | INTRAVENOUS | Status: AC
  Administered 2014-01-27 (×2): 600 mg via INTRAVENOUS
  Filled 2014-01-27 (×2): qty 50

## 2014-01-27 MED ORDER — ONDANSETRON HCL 4 MG/2ML IJ SOLN
INTRAMUSCULAR | Status: AC
  Filled 2014-01-27: qty 2

## 2014-01-27 MED ORDER — LACTATED RINGERS IV SOLN
INTRAVENOUS | Status: DC | PRN
  Administered 2014-01-27: 05:00:00 via INTRAVENOUS

## 2014-01-27 MED ORDER — VALPROIC ACID 250 MG PO CAPS
500.0000 mg | ORAL_CAPSULE | Freq: Every day | ORAL | Status: DC
  Administered 2014-01-27 – 2014-02-02 (×7): 500 mg via ORAL
  Filled 2014-01-27 (×8): qty 2

## 2014-01-27 MED ORDER — PROPOFOL INFUSION 10 MG/ML OPTIME
INTRAVENOUS | Status: DC | PRN
  Administered 2014-01-27: 120 ug/kg/min via INTRAVENOUS

## 2014-01-27 MED ORDER — 0.9 % SODIUM CHLORIDE (POUR BTL) OPTIME
TOPICAL | Status: DC | PRN
  Administered 2014-01-27: 500 mL

## 2014-01-27 MED ORDER — NALOXONE HCL 0.4 MG/ML IJ SOLN: 0.4000 mg | INTRAMUSCULAR | Status: DC | PRN

## 2014-01-27 MED ORDER — FENTANYL CITRATE 0.05 MG/ML IJ SOLN
INTRAMUSCULAR | Status: AC
  Filled 2014-01-27: qty 5

## 2014-01-27 MED ORDER — DIPHENHYDRAMINE HCL 50 MG/ML IJ SOLN: 12.5000 mg | Freq: Four times a day (QID) | INTRAMUSCULAR | Status: DC | PRN

## 2014-01-27 MED ORDER — LIDOCAINE HCL (CARDIAC) 20 MG/ML IV SOLN
INTRAVENOUS | Status: AC
  Filled 2014-01-27: qty 5

## 2014-01-27 MED ORDER — FENTANYL CITRATE 0.05 MG/ML IJ SOLN
INTRAMUSCULAR | Status: DC | PRN
  Administered 2014-01-27: 25 ug via INTRAVENOUS
  Administered 2014-01-27: 50 ug via INTRAVENOUS
  Administered 2014-01-27 (×3): 25 ug via INTRAVENOUS

## 2014-01-27 MED ORDER — MIDAZOLAM HCL 2 MG/2ML IJ SOLN
INTRAMUSCULAR | Status: AC
  Filled 2014-01-27: qty 2

## 2014-01-27 MED ORDER — OXYCODONE HCL 5 MG PO TABS
5.0000 mg | ORAL_TABLET | Freq: Once | ORAL | Status: AC | PRN
  Administered 2014-01-27: 5 mg via ORAL
  Filled 2014-01-27: qty 1

## 2014-01-27 MED ORDER — KCL IN DEXTROSE-NACL 20-5-0.45 MEQ/L-%-% IV SOLN
INTRAVENOUS | Status: DC
  Administered 2014-01-27 (×2): via INTRAVENOUS
  Filled 2014-01-27 (×3): qty 1000

## 2014-01-27 MED ORDER — ONDANSETRON HCL 4 MG PO TABS: 4.0000 mg | ORAL_TABLET | Freq: Four times a day (QID) | ORAL | Status: DC | PRN

## 2014-01-27 MED ORDER — PNEUMOCOCCAL VAC POLYVALENT 25 MCG/0.5ML IJ INJ
0.5000 mL | INJECTION | INTRAMUSCULAR | Status: AC
  Administered 2014-01-28: 0.5 mL via INTRAMUSCULAR
  Filled 2014-01-27 (×2): qty 0.5

## 2014-01-27 MED ORDER — ARTIFICIAL TEARS OP OINT
TOPICAL_OINTMENT | OPHTHALMIC | Status: AC
  Filled 2014-01-27: qty 3.5

## 2014-01-27 MED ORDER — FENTANYL 10 MCG/ML IV SOLN
INTRAVENOUS | Status: DC
Start: 2014-01-27 — End: 2014-01-27
  Administered 2014-01-27: 09:00:00 via INTRAVENOUS
  Administered 2014-01-27: 21 ug via INTRAVENOUS
  Administered 2014-01-27: 16:00:00 via INTRAVENOUS
  Administered 2014-01-27: 450 ug/h via INTRAVENOUS
  Filled 2014-01-27 (×2): qty 50

## 2014-01-27 MED ORDER — ARTIFICIAL TEARS OP OINT
TOPICAL_OINTMENT | OPHTHALMIC | Status: DC | PRN
  Administered 2014-01-27: 1 via OPHTHALMIC

## 2014-01-27 MED ORDER — CLINDAMYCIN PHOSPHATE 600 MG/50ML IV SOLN
600.0000 mg | INTRAVENOUS | Status: AC
  Administered 2014-01-27: 600 mg via INTRAVENOUS
  Filled 2014-01-27: qty 50

## 2014-01-27 MED ORDER — PROPOFOL 10 MG/ML IV BOLUS
INTRAVENOUS | Status: DC | PRN
  Administered 2014-01-27: 25 mg via INTRAVENOUS
  Administered 2014-01-27: 120 mg via INTRAVENOUS

## 2014-01-27 MED ORDER — VALPROIC ACID 250 MG PO CAPS
250.0000 mg | ORAL_CAPSULE | Freq: Every day | ORAL | Status: DC
  Administered 2014-01-27 – 2014-02-03 (×8): 250 mg via ORAL
  Filled 2014-01-27 (×8): qty 1

## 2014-01-27 MED ORDER — ONDANSETRON HCL 4 MG/2ML IJ SOLN
INTRAMUSCULAR | Status: DC | PRN
  Administered 2014-01-27 (×2): 4 mg via INTRAVENOUS

## 2014-01-27 MED ORDER — PROPOFOL 10 MG/ML IV BOLUS
INTRAVENOUS | Status: AC
  Filled 2014-01-27: qty 20

## 2014-01-27 MED ORDER — SODIUM CHLORIDE 0.9 % IJ SOLN
INTRAMUSCULAR | Status: AC
  Filled 2014-01-27: qty 10

## 2014-01-27 MED ORDER — MUPIROCIN 2 % EX OINT
TOPICAL_OINTMENT | CUTANEOUS | Status: AC
  Filled 2014-01-27: qty 22

## 2014-01-27 MED ORDER — OXYCODONE HCL 5 MG/5ML PO SOLN: 5.0000 mg | Freq: Once | ORAL | Status: AC | PRN

## 2014-01-27 MED ORDER — HYDROMORPHONE HCL PF 1 MG/ML IJ SOLN
1.0000 mg | Freq: Once | INTRAMUSCULAR | Status: AC
  Administered 2014-01-27: 1 mg via INTRAVENOUS
  Filled 2014-01-27: qty 1

## 2014-01-27 MED ORDER — HYDROMORPHONE HCL PF 1 MG/ML IJ SOLN: 0.2500 mg | INTRAMUSCULAR | Status: DC | PRN

## 2014-01-27 MED ORDER — VALPROIC ACID 250 MG PO CAPS: 250.0000 mg | ORAL_CAPSULE | Freq: Two times a day (BID) | ORAL | Status: DC

## 2014-01-27 MED ORDER — MIDAZOLAM HCL 5 MG/5ML IJ SOLN
INTRAMUSCULAR | Status: DC | PRN
  Administered 2014-01-27: 2 mg via INTRAVENOUS

## 2014-01-27 MED ORDER — FENTANYL 10 MCG/ML IV SOLN
INTRAVENOUS | Status: DC
  Administered 2014-01-27: 190 ug via INTRAVENOUS
  Administered 2014-01-28: 07:00:00 via INTRAVENOUS
  Administered 2014-01-28: 360 ug/h via INTRAVENOUS
  Administered 2014-01-28: via INTRAVENOUS
  Administered 2014-01-28: 300 ug/h via INTRAVENOUS
  Filled 2014-01-27 (×3): qty 50

## 2014-01-27 MED ORDER — PANTOPRAZOLE SODIUM 40 MG IV SOLR
40.0000 mg | Freq: Every day | INTRAVENOUS | Status: DC
  Filled 2014-01-27 (×3): qty 40

## 2014-01-27 SURGICAL SUPPLY — 41 items
BANDAGE ELASTIC 4 VELCRO ST LF (GAUZE/BANDAGES/DRESSINGS) ×4 IMPLANT
BANDAGE ELASTIC 6 VELCRO ST LF (GAUZE/BANDAGES/DRESSINGS) ×4 IMPLANT
BNDG COHESIVE 4X5 TAN STRL (GAUZE/BANDAGES/DRESSINGS) ×4 IMPLANT
CLEANER TIP ELECTROSURG 2X2 (MISCELLANEOUS) ×4 IMPLANT
COVER SURGICAL LIGHT HANDLE (MISCELLANEOUS) ×4 IMPLANT
DOPPLER CAUTERY SUPPRESSOR (INSTRUMENTS) ×4 IMPLANT
DRAPE C-ARM 42X72 X-RAY (DRAPES) ×4 IMPLANT
DRSG ADAPTIC 3X8 NADH LF (GAUZE/BANDAGES/DRESSINGS) ×4 IMPLANT
DRSG PAD ABDOMINAL 8X10 ST (GAUZE/BANDAGES/DRESSINGS) ×4 IMPLANT
DURAPREP 26ML APPLICATOR (WOUND CARE) ×4 IMPLANT
ELECT REM PT RETURN 9FT ADLT (ELECTROSURGICAL) ×4
ELECTRODE REM PT RTRN 9FT ADLT (ELECTROSURGICAL) ×2 IMPLANT
GAUZE XEROFORM 5X9 LF (GAUZE/BANDAGES/DRESSINGS) ×4 IMPLANT
GLOVE BIOGEL PI IND STRL 7.5 (GLOVE) ×2 IMPLANT
GLOVE BIOGEL PI INDICATOR 7.5 (GLOVE) ×2
GLOVE SURG SS PI 8.0 STRL IVOR (GLOVE) ×8 IMPLANT
GOWN EXTRA PROTECTION XL (GOWNS) ×4 IMPLANT
GOWN STRL REUS W/ TWL LRG LVL3 (GOWN DISPOSABLE) ×2 IMPLANT
GOWN STRL REUS W/TWL LRG LVL3 (GOWN DISPOSABLE) ×2
KIT BASIN OR (CUSTOM PROCEDURE TRAY) ×4 IMPLANT
KIT ROOM TURNOVER OR (KITS) ×4 IMPLANT
MANIFOLD NEPTUNE II (INSTRUMENTS) ×4 IMPLANT
NS IRRIG 1000ML POUR BTL (IV SOLUTION) ×8 IMPLANT
PACK ORTHO EXTREMITY (CUSTOM PROCEDURE TRAY) ×4 IMPLANT
PAD ABD 8X10 STRL (GAUZE/BANDAGES/DRESSINGS) ×12 IMPLANT
PAD ARMBOARD 7.5X6 YLW CONV (MISCELLANEOUS) ×4 IMPLANT
PAD CAST 4YDX4 CTTN HI CHSV (CAST SUPPLIES) ×4 IMPLANT
PADDING CAST COTTON 4X4 STRL (CAST SUPPLIES) ×4
SPLINT FIBERGLASS 4X30 (CAST SUPPLIES) ×8 IMPLANT
SPONGE GAUZE 4X4 12PLY (GAUZE/BANDAGES/DRESSINGS) ×4 IMPLANT
SPONGE GAUZE 4X4 12PLY STER LF (GAUZE/BANDAGES/DRESSINGS) ×4 IMPLANT
SPONGE LAP 18X18 X RAY DECT (DISPOSABLE) ×4 IMPLANT
SPONGE LAP 4X18 X RAY DECT (DISPOSABLE) ×8 IMPLANT
STAPLER VISISTAT (STAPLE) ×8 IMPLANT
STAPLER VISISTAT 35W (STAPLE) IMPLANT
SUCTION FRAZIER TIP 10 FR DISP (SUCTIONS) ×4 IMPLANT
SUT VIC AB 2-0 CTB1 (SUTURE) ×16 IMPLANT
TOWEL OR 17X26 10 PK STRL BLUE (TOWEL DISPOSABLE) ×4 IMPLANT
TUBE CONNECTING 12'X1/4 (SUCTIONS) ×1
TUBE CONNECTING 12X1/4 (SUCTIONS) ×3 IMPLANT
YANKAUER SUCT BULB TIP NO VENT (SUCTIONS) IMPLANT

## 2014-01-27 NOTE — Anesthesia Preprocedure Evaluation (Signed)
Anesthesia Evaluation  Patient identified by MRN, date of birth, ID band Patient awake    Reviewed: Allergy & Precautions, H&P , NPO status , Patient's Chart, lab work & pertinent test results  Airway Mallampati: III TM Distance: >3 FB Neck ROM: Full    Dental no notable dental hx. (+) Poor Dentition, Dental Advisory Given   Pulmonary Current Smoker,  breath sounds clear to auscultation  Pulmonary exam normal       Cardiovascular negative cardio ROS  Rhythm:Regular Rate:Tachycardia     Neuro/Psych Seizures -, Well Controlled,  PSYCHIATRIC DISORDERS Anxiety Depression Arthrogryposis which puts him at risk for MH and difficult airway.    GI/Hepatic negative GI ROS, Neg liver ROS,   Endo/Other  negative endocrine ROS  Renal/GU negative Renal ROS  negative genitourinary   Musculoskeletal   Abdominal   Peds  Hematology negative hematology ROS (+)   Anesthesia Other Findings   Reproductive/Obstetrics negative OB ROS                           Anesthesia Physical Anesthesia Plan  ASA: III and emergent  Anesthesia Plan: General   Post-op Pain Management:    Induction: Intravenous  Airway Management Planned: Video Laryngoscope Planned and Oral ETT  Additional Equipment: CVP  Intra-op Plan:   Post-operative Plan: Extubation in OR and Possible Post-op intubation/ventilation  Informed Consent: I have reviewed the patients History and Physical, chart, labs and discussed the procedure including the risks, benefits and alternatives for the proposed anesthesia with the patient or authorized representative who has indicated his/her understanding and acceptance.   Dental advisory given  Plan Discussed with: CRNA  Anesthesia Plan Comments:         Anesthesia Quick Evaluation

## 2014-01-27 NOTE — Progress Notes (Signed)
New order received to increase PCA to full-dose fentanyl.  PCA changed at this time to patient controlled bolus dose.  Pt educated and understands.  Continue to monitor.

## 2014-01-27 NOTE — Brief Op Note (Signed)
01/26/2014 - 01/27/2014  6:52 AM  PATIENT:  [name]  33 y.o. male  PRE-OPERATIVE DIAGNOSIS:  compartment syndrome right leg  POST-OPERATIVE DIAGNOSIS:  compartment syndrome right leg  PROCEDURE:  Procedure(s): COMPARTMENT SYNDROME RELEASE WITH FASCIOTOMY RIGHT LOWER LEG (Right)  SURGEON:  Surgeon(s) and Role:    * Javier DockerJeffrey C Ahrianna Siglin, MD - Primary  PHYSICIAN ASSISTANT:   ASSISTANTS: none   ANESTHESIA:   general  EBL:  Total I/O In: 850 [I.V.:850] Out: 100 [Blood:100]  BLOOD ADMINISTERED:none  DRAINS: none   LOCAL MEDICATIONS USED:  MARCAINE     SPECIMEN:  No Specimen  DISPOSITION OF SPECIMEN:  N/A  COUNTS:  YES  TOURNIQUET:  * No tourniquets in log *  DICTATION: .Other Dictation: Dictation Number  [id number]  PLAN OF CARE: Admit to inpatient   PATIENT DISPOSITION:  PACU - hemodynamically stable.   Delay start of Pharmacological VTE agent (>24hrs) due to surgical blood loss or risk of bleeding: yes

## 2014-01-27 NOTE — Progress Notes (Signed)
A spiritual consult was requested by the pt.  Chaplain provided prayer, comfort and a listening ear. Pt was very grateful for the prayer and encouragement.   Troy CrumblyBeverley Arbor Leer, [address]
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
## 2014-01-27 NOTE — Anesthesia Postprocedure Evaluation (Signed)
  Anesthesia Post-op Note  Patient: [name]  Procedure(s) Performed: Procedure(s): COMPARTMENT SYNDROME RELEASE WITH FASCIOTOMY RIGHT LOWER LEG (Right)  Patient Location: PACU  Anesthesia Type:General  Level of Consciousness: awake and alert   Airway and Oxygen Therapy: Patient Spontanous Breathing  Post-op Pain: none  Post-op Assessment: Post-op Vital signs reviewed, Patient's Cardiovascular Status Stable and Respiratory Function Stable  Post-op Vital Signs: Reviewed  Filed Vitals:   01/27/14 0730  BP: 122/74  Pulse: 106  Temp:   Resp: 20    Complications: No apparent anesthesia complications

## 2014-01-27 NOTE — ED Notes (Signed)
Dr Otter at bedside  
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
## 2014-01-27 NOTE — Progress Notes (Signed)
UR completed.  Sable Knoles, RN BSN [facility] CCM Trauma/Neuro ICU Case Manager [phone]  
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
## 2014-01-27 NOTE — Anesthesia Procedure Notes (Addendum)
Procedure Name: Intubation Date/Time: 01/27/2014 5:24 AM Performed by: Julianne RiceBILOTTA, Tenea Sens Z Pre-anesthesia Checklist: Patient identified, Timeout performed, Emergency Drugs available, Suction available and Patient being monitored Patient Re-evaluated:Patient Re-evaluated prior to inductionOxygen Delivery Method: Circle system utilized Preoxygenation: Pre-oxygenation with 100% oxygen Intubation Type: IV induction Grade View: Grade I Tube type: Subglottic suction tube Tube size: 7.0 mm Number of attempts: 1 Airway Equipment and Method: Stylet Placement Confirmation: ETT inserted through vocal cords under direct vision,  breath sounds checked- equal and bilateral and positive ETCO2 Secured at: 22 cm Tube secured with: Tape Dental Injury: Teeth and Oropharynx as per pre-operative assessment  Difficulty Due To: Difficulty was anticipated Comments: Topical local anesthetic and transtracheal block performed by Dr. Sampson GoonFitzgerald; awake glide scope intubation 1st attempt with ease; positive end tidal CO2; IV Diprivan once ETT placement confirmed; pt. tolerated awake intubation extremely well.

## 2014-01-27 NOTE — Transfer of Care (Signed)
Immediate Anesthesia Transfer of Care Note  Patient: [name]  Procedure(s) Performed: Procedure(s): COMPARTMENT SYNDROME RELEASE WITH FASCIOTOMY RIGHT LOWER LEG (Right)  Patient Location: PACU  Anesthesia Type:General  Level of Consciousness: awake, alert  and oriented  Airway & Oxygen Therapy: Patient Spontanous Breathing  Post-op Assessment: Report given to PACU RN, Post -op Vital signs reviewed and stable and Patient moving all extremities X 4  Post vital signs: Reviewed and stable  Complications: No apparent anesthesia complications
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
## 2014-01-27 NOTE — Progress Notes (Signed)
I have seen and examined the patient and agree with the assessment and plans.  Kassidy Dockendorf A. Jovani Colquhoun  MD, FACS  

## 2014-01-27 NOTE — Progress Notes (Signed)
Patient requesting additional pain medication.  Reports RLE pain at level  8 with Fentanyl low dose PCA.  RLE repositioned, elevated and ice applied.  On-call paged.
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
## 2014-01-27 NOTE — ED Notes (Signed)
Dr. Janee Mornhompson at bedside.    Call [facility] [phone] when pt available for transport.  Pt to go to short stay, bed 36.

## 2014-01-28 ENCOUNTER — Encounter (HOSPITAL_COMMUNITY): Payer: Self-pay | Admitting: Orthopedic Surgery

## 2014-01-28 LAB — CBC
HCT: 36.5 % — ABNORMAL LOW (ref 39.0–52.0)
Hemoglobin: 12.1 g/dL — ABNORMAL LOW (ref 13.0–17.0)
MCH: 32.6 pg (ref 26.0–34.0)
MCHC: 33.2 g/dL (ref 30.0–36.0)
MCV: 98.4 fL (ref 78.0–100.0)
Platelets: 163 10*3/uL (ref 150–400)
RBC: 3.71 MIL/uL — AB (ref 4.22–5.81)
RDW: 13.2 % (ref 11.5–15.5)
WBC: 18.4 10*3/uL — ABNORMAL HIGH (ref 4.0–10.5)

## 2014-01-28 MED ORDER — DIPHENHYDRAMINE HCL 25 MG PO CAPS
25.0000 mg | ORAL_CAPSULE | Freq: Four times a day (QID) | ORAL | Status: DC | PRN
  Administered 2014-01-28 – 2014-01-30 (×5): 25 mg via ORAL
  Filled 2014-01-28 (×5): qty 1

## 2014-01-28 MED ORDER — OXYCODONE HCL 5 MG PO TABS
5.0000 mg | ORAL_TABLET | ORAL | Status: DC | PRN
  Administered 2014-01-28 – 2014-01-30 (×9): 10 mg via ORAL
  Filled 2014-01-28 (×9): qty 2

## 2014-01-28 MED ORDER — CLINDAMYCIN PHOSPHATE 600 MG/50ML IV SOLN
600.0000 mg | Freq: Three times a day (TID) | INTRAVENOUS | Status: AC
  Administered 2014-01-28 (×3): 600 mg via INTRAVENOUS
  Filled 2014-01-28 (×3): qty 50

## 2014-01-28 MED ORDER — SODIUM CHLORIDE 0.9 % IJ SOLN: 3.0000 mL | INTRAMUSCULAR | Status: DC | PRN

## 2014-01-28 MED ORDER — HYDROMORPHONE HCL PF 1 MG/ML IJ SOLN
1.0000 mg | INTRAMUSCULAR | Status: DC | PRN
  Administered 2014-01-28 – 2014-01-30 (×13): 1 mg via INTRAVENOUS
  Filled 2014-01-28 (×13): qty 1

## 2014-01-28 MED ORDER — POLYETHYLENE GLYCOL 3350 17 G PO PACK
17.0000 g | PACK | Freq: Every day | ORAL | Status: DC
  Administered 2014-01-28 – 2014-01-30 (×2): 17 g via ORAL
  Filled 2014-01-28 (×8): qty 1

## 2014-01-28 MED ORDER — ENOXAPARIN SODIUM 40 MG/0.4ML ~~LOC~~ SOLN
40.0000 mg | SUBCUTANEOUS | Status: DC
  Administered 2014-01-28 – 2014-02-03 (×6): 40 mg via SUBCUTANEOUS
  Filled 2014-01-28 (×8): qty 0.4

## 2014-01-28 MED ORDER — CLINDAMYCIN PHOSPHATE 600 MG/50ML IV SOLN
600.0000 mg | Freq: Once | INTRAVENOUS | Status: AC
  Administered 2014-01-29: 600 mg via INTRAVENOUS
  Filled 2014-01-28: qty 50

## 2014-01-28 MED ORDER — BACITRACIN-NEOMYCIN-POLYMYXIN OINTMENT TUBE
1.0000 "application " | TOPICAL_OINTMENT | Freq: Every day | CUTANEOUS | Status: DC
  Administered 2014-01-28 – 2014-01-29 (×2): 1 via TOPICAL
  Filled 2014-01-28: qty 15

## 2014-01-28 MED ORDER — SODIUM CHLORIDE 0.9 % IJ SOLN
3.0000 mL | Freq: Two times a day (BID) | INTRAMUSCULAR | Status: DC
  Administered 2014-01-31 – 2014-02-02 (×3): 3 mL via INTRAVENOUS

## 2014-01-28 MED ORDER — SODIUM CHLORIDE 0.9 % IJ SOLN
10.0000 mL | INTRAMUSCULAR | Status: DC | PRN
  Administered 2014-01-29 – 2014-01-30 (×2): 20 mL
  Administered 2014-01-31: 10 mL
  Administered 2014-02-01: 20 mL

## 2014-01-28 MED ORDER — SODIUM CHLORIDE 0.9 % IV SOLN
250.0000 mL | INTRAVENOUS | Status: DC | PRN
  Administered 2014-01-28: 250 mL via INTRAVENOUS
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
## 2014-01-28 NOTE — Progress Notes (Signed)
[employer] notified at this time regarding possible readjustment of right lower ACE splint d/t causing pain and discomfort to right heel. [employer] verbalized understanding.
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
## 2014-01-28 NOTE — Progress Notes (Signed)
OT Cancellation Note  Patient Details Name: [name] MRN: [id number] DOB: 10-Jul-1981   Cancelled Treatment:    Reason Eval/Treat Not Completed:  Pt with bedrest orders in place. Plan is for return to OR tomorrow for I & D and closure of fasciotomies.  Will plan to evaluate afterwards and upgrade of activity order as appropriate.  Evern BioMayberry, Jolan Upchurch Lynn 01/28/2014, 10:53 AM [phone]

## 2014-01-28 NOTE — Progress Notes (Signed)
Chaplain Note: Patient reports being visited by a contract chaplain 1 day prior. Patient also reports having prayed with the chaplain. Unit Chaplain visited patient once at approx. 2:30p. Patient reports having been medicated prior to my arrival. Patient asked chaplain to come back later today. Chaplain visited patient around 3:30p, but patient was visiting with his friend, while taking a phone call. Please create a new order if patient requests another visit.  Toni AmendAndria Williamson
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
## 2014-01-28 NOTE — Progress Notes (Signed)
PCA pump d/c, wasted 360mcg with Clydie BraunKaren Lowe-Bumper in sharps container at this time.

## 2014-01-28 NOTE — Progress Notes (Signed)
Orthopedic Tech Progress Note Patient Details:  [name] 1981-06-19 [id number]  Ortho Devices Type of Ortho Device: Stirrup splint;Post (short leg) splint;Ace wrap Ortho Device/Splint Interventions: Application   Cammer, Mickie BailJennifer Carol 01/28/2014, 12:42 PM

## 2014-01-28 NOTE — Progress Notes (Signed)
Spoke with patient. Have consulted Dr. Carola FrostHandy to assume care for patient. Closure of wound And possible ORIF tib-fib. Will have [facility] adjust splint if needed.
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
## 2014-01-28 NOTE — Progress Notes (Signed)
Patient to go to the OR tomorrow.  This patient has been seen and I agree with the findings and treatment plan.  Marta LamasJames O. Gae BonWyatt, III, MD, FACS [phone] (pager) [phone] (direct pager) Trauma Surgeon

## 2014-01-29 ENCOUNTER — Encounter (HOSPITAL_COMMUNITY): Payer: No Typology Code available for payment source | Admitting: Anesthesiology

## 2014-01-29 ENCOUNTER — Encounter (HOSPITAL_COMMUNITY): Payer: Self-pay | Admitting: Anesthesiology

## 2014-01-29 ENCOUNTER — Inpatient Hospital Stay (HOSPITAL_COMMUNITY): Payer: No Typology Code available for payment source

## 2014-01-29 ENCOUNTER — Encounter (HOSPITAL_COMMUNITY): Admission: EM | Disposition: A | Discharge status: Skilled Nursing Facility | Payer: Self-pay | Source: Home / Self Care

## 2014-01-29 ENCOUNTER — Inpatient Hospital Stay (HOSPITAL_COMMUNITY): Payer: No Typology Code available for payment source | Admitting: Anesthesiology

## 2014-01-29 HISTORY — PX: I & D EXTREMITY: SHX5045

## 2014-01-29 SURGERY — IRRIGATION AND DEBRIDEMENT EXTREMITY
Anesthesia: General | Site: Leg Lower | Laterality: Right

## 2014-01-29 MED ORDER — MIDAZOLAM HCL 2 MG/2ML IJ SOLN
INTRAMUSCULAR | Status: AC
  Filled 2014-01-29: qty 2

## 2014-01-29 MED ORDER — PROPOFOL 10 MG/ML IV BOLUS
INTRAVENOUS | Status: DC | PRN
  Administered 2014-01-29: 200 mg via INTRAVENOUS

## 2014-01-29 MED ORDER — ONDANSETRON HCL 4 MG/2ML IJ SOLN
INTRAMUSCULAR | Status: AC
  Filled 2014-01-29: qty 2

## 2014-01-29 MED ORDER — SODIUM CHLORIDE 0.9 % IV SOLN: INTRAVENOUS | Status: DC

## 2014-01-29 MED ORDER — SODIUM CHLORIDE 0.9 % IR SOLN
Status: DC | PRN
  Administered 2014-01-29: 1000 mL

## 2014-01-29 MED ORDER — ONDANSETRON HCL 4 MG/2ML IJ SOLN: 4.0000 mg | Freq: Once | INTRAMUSCULAR | Status: DC | PRN

## 2014-01-29 MED ORDER — FENTANYL CITRATE 0.05 MG/ML IJ SOLN
INTRAMUSCULAR | Status: DC | PRN
  Administered 2014-01-29: 50 ug via INTRAVENOUS

## 2014-01-29 MED ORDER — LACTATED RINGERS IV SOLN
INTRAVENOUS | Status: DC | PRN
  Administered 2014-01-29: 11:00:00 via INTRAVENOUS

## 2014-01-29 MED ORDER — HYDROMORPHONE HCL PF 1 MG/ML IJ SOLN
INTRAMUSCULAR | Status: AC
  Administered 2014-01-29: 0.5 mg via INTRAVENOUS
  Filled 2014-01-29: qty 2

## 2014-01-29 MED ORDER — LACTATED RINGERS IV SOLN
INTRAVENOUS | Status: DC
  Administered 2014-01-29: 11:00:00 via INTRAVENOUS

## 2014-01-29 MED ORDER — HYDROMORPHONE HCL PF 1 MG/ML IJ SOLN
0.2500 mg | INTRAMUSCULAR | Status: DC | PRN
  Administered 2014-01-29 (×2): 0.5 mg via INTRAVENOUS

## 2014-01-29 MED ORDER — PHENYLEPHRINE HCL 10 MG/ML IJ SOLN
INTRAMUSCULAR | Status: DC | PRN
  Administered 2014-01-29 (×2): 80 ug via INTRAVENOUS

## 2014-01-29 MED ORDER — LIDOCAINE HCL (CARDIAC) 20 MG/ML IV SOLN
INTRAVENOUS | Status: DC | PRN
  Administered 2014-01-29: 80 mg via INTRAVENOUS

## 2014-01-29 MED ORDER — CLINDAMYCIN PHOSPHATE 600 MG/50ML IV SOLN
600.0000 mg | Freq: Three times a day (TID) | INTRAVENOUS | Status: AC
  Administered 2014-01-29 – 2014-01-30 (×3): 600 mg via INTRAVENOUS
  Filled 2014-01-29 (×3): qty 50

## 2014-01-29 MED ORDER — PROPOFOL 10 MG/ML IV BOLUS
INTRAVENOUS | Status: AC
  Filled 2014-01-29: qty 20

## 2014-01-29 MED ORDER — FENTANYL CITRATE 0.05 MG/ML IJ SOLN
INTRAMUSCULAR | Status: AC
  Filled 2014-01-29: qty 5

## 2014-01-29 SURGICAL SUPPLY — 47 items
BANDAGE GAUZE ELAST BULKY 4 IN (GAUZE/BANDAGES/DRESSINGS) ×3 IMPLANT
BLADE SURG 10 STRL SS (BLADE) IMPLANT
BNDG COHESIVE 4X5 TAN STRL (GAUZE/BANDAGES/DRESSINGS) IMPLANT
BNDG GAUZE STRTCH 6 (GAUZE/BANDAGES/DRESSINGS) IMPLANT
BRUSH SCRUB DISP (MISCELLANEOUS) ×6 IMPLANT
COVER SURGICAL LIGHT HANDLE (MISCELLANEOUS) ×3 IMPLANT
DRAPE U-SHAPE 47X51 STRL (DRAPES) ×3 IMPLANT
DRSG ADAPTIC 3X8 NADH LF (GAUZE/BANDAGES/DRESSINGS) ×3 IMPLANT
ELECT CAUTERY BLADE 6.4 (BLADE) IMPLANT
ELECT REM PT RETURN 9FT ADLT (ELECTROSURGICAL) ×3
ELECTRODE REM PT RTRN 9FT ADLT (ELECTROSURGICAL) ×1 IMPLANT
GLOVE BIO SURGEON STRL SZ7.5 (GLOVE) ×3 IMPLANT
GLOVE BIO SURGEON STRL SZ8 (GLOVE) ×3 IMPLANT
GLOVE BIOGEL PI IND STRL 7.5 (GLOVE) ×1 IMPLANT
GLOVE BIOGEL PI IND STRL 8 (GLOVE) ×1 IMPLANT
GLOVE BIOGEL PI INDICATOR 7.5 (GLOVE) ×2
GLOVE BIOGEL PI INDICATOR 8 (GLOVE) ×2
GOWN STRL REUS W/ TWL LRG LVL3 (GOWN DISPOSABLE) ×2 IMPLANT
GOWN STRL REUS W/ TWL XL LVL3 (GOWN DISPOSABLE) ×1 IMPLANT
GOWN STRL REUS W/TWL LRG LVL3 (GOWN DISPOSABLE) ×4
GOWN STRL REUS W/TWL XL LVL3 (GOWN DISPOSABLE) ×2
HANDPIECE INTERPULSE COAX TIP (DISPOSABLE)
KIT BASIN OR (CUSTOM PROCEDURE TRAY) ×3 IMPLANT
KIT ROOM TURNOVER OR (KITS) ×3 IMPLANT
MANIFOLD NEPTUNE II (INSTRUMENTS) ×3 IMPLANT
NS IRRIG 1000ML POUR BTL (IV SOLUTION) ×3 IMPLANT
PACK ORTHO EXTREMITY (CUSTOM PROCEDURE TRAY) ×3 IMPLANT
PAD ARMBOARD 7.5X6 YLW CONV (MISCELLANEOUS) ×6 IMPLANT
PAD CAST 4YDX4 CTTN HI CHSV (CAST SUPPLIES) ×1 IMPLANT
PADDING CAST ABS 6INX4YD NS (CAST SUPPLIES) ×2
PADDING CAST ABS COTTON 6X4 NS (CAST SUPPLIES) ×1 IMPLANT
PADDING CAST COTTON 4X4 STRL (CAST SUPPLIES) ×2
PADDING CAST COTTON 6X4 STRL (CAST SUPPLIES) ×3 IMPLANT
SET HNDPC FAN SPRY TIP SCT (DISPOSABLE) IMPLANT
SPONGE GAUZE 4X4 12PLY (GAUZE/BANDAGES/DRESSINGS) ×3 IMPLANT
SPONGE GAUZE 4X4 12PLY STER LF (GAUZE/BANDAGES/DRESSINGS) ×3 IMPLANT
SPONGE LAP 18X18 X RAY DECT (DISPOSABLE) ×3 IMPLANT
STOCKINETTE IMPERVIOUS 9X36 MD (GAUZE/BANDAGES/DRESSINGS) IMPLANT
SUT PDS AB 2-0 CT1 27 (SUTURE) IMPLANT
TOWEL OR 17X24 6PK STRL BLUE (TOWEL DISPOSABLE) ×3 IMPLANT
TOWEL OR 17X26 10 PK STRL BLUE (TOWEL DISPOSABLE) ×6 IMPLANT
TUBE ANAEROBIC SPECIMEN COL (MISCELLANEOUS) IMPLANT
TUBE CONNECTING 12'X1/4 (SUCTIONS) ×1
TUBE CONNECTING 12X1/4 (SUCTIONS) ×2 IMPLANT
UNDERPAD 30X30 INCONTINENT (UNDERPADS AND DIAPERS) ×3 IMPLANT
WATER STERILE IRR 1000ML POUR (IV SOLUTION) IMPLANT
YANKAUER SUCT BULB TIP NO VENT (SUCTIONS) ×3 IMPLANT
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
## 2014-01-29 NOTE — Anesthesia Preprocedure Evaluation (Addendum)
Anesthesia Evaluation  Patient identified by MRN, date of birth, ID band Patient awake    Reviewed: Allergy & Precautions, H&P , NPO status , Patient's Chart, lab work & pertinent test results  Airway Mallampati: II      Dental  (+) Poor Dentition   Pulmonary Current Smoker,          Cardiovascular     Neuro/Psych Seizures -,     GI/Hepatic GERD-  ,  Endo/Other    Renal/GU      Musculoskeletal   Abdominal   Peds  Hematology   Anesthesia Other Findings   Reproductive/Obstetrics                         Anesthesia Physical Anesthesia Plan  ASA: II  Anesthesia Plan: General   Post-op Pain Management:    Induction: Intravenous  Airway Management Planned: Oral ETT and LMA  Additional Equipment:   Intra-op Plan:   Post-operative Plan: Extubation in OR  Informed Consent: I have reviewed the patients History and Physical, chart, labs and discussed the procedure including the risks, benefits and alternatives for the proposed anesthesia with the patient or authorized representative who has indicated his/her understanding and acceptance.     Plan Discussed with:   Anesthesia Plan Comments:         Anesthesia Quick Evaluation

## 2014-01-29 NOTE — Consult Note (Signed)
I have seen and examined the patient. I agree with the findings above.  I discussed with the patient the risks and benefits of surgery for his right leg fasciotomy closures and associated tib fib fractures, including the possibility of infection, nerve injury, vessel injury, malunion, nonunion, wound breakdown, arthritis, symptomatic hardware, DVT/ PE, loss of motion, and need for further surgery among others.  He understood these risks and wished to proceed.   Budd PalmerHANDY,Quinette Hentges H, MD 01/29/2014 11:02 AM

## 2014-01-29 NOTE — Transfer of Care (Signed)
Immediate Anesthesia Transfer of Care Note  Patient: Troy HavenJoseph Carter  Procedure(s) Performed: Procedure(s): IRRIGATION AND DEBRIDEMENT EXTREMITY/CLOSURE OF WOUND RIGHT LEG (Right)  Patient Location: PACU  Anesthesia Type:General  Level of Consciousness: awake, alert  and oriented  Airway & Oxygen Therapy: Patient Spontanous Breathing and Patient connected to nasal cannula oxygen  Post-op Assessment: Report given to PACU RN and Post -op Vital signs reviewed and stable  Post vital signs: Reviewed and stable  Complications: No apparent anesthesia complications

## 2014-01-29 NOTE — Progress Notes (Signed)
This patient has been seen and I agree with the findings and treatment plan.  Anntonette Madewell O. Haja Crego, III, MD, FACS [phone] (pager) [phone] (direct pager) Trauma Surgeon  

## 2014-01-29 NOTE — Anesthesia Procedure Notes (Signed)
Procedure Name: LMA Insertion Date/Time: 01/29/2014 11:26 AM Performed by: Sharlene DoryWALKER, Adisyn Ruscitti E Pre-anesthesia Checklist: Patient identified, Emergency Drugs available, Suction available, Patient being monitored and Timeout performed Patient Re-evaluated:Patient Re-evaluated prior to inductionOxygen Delivery Method: Circle system utilized Preoxygenation: Pre-oxygenation with 100% oxygen Intubation Type: IV induction Tube size: 3.0 mm Number of attempts: 1 Placement Confirmation: positive ETCO2 and breath sounds checked- equal and bilateral Tube secured with: Tape Dental Injury: Teeth and Oropharynx as per pre-operative assessment

## 2014-01-29 NOTE — Brief Op Note (Signed)
01/26/2014 - 01/29/2014  12:11 PM  PATIENT:  Vale HavenJoseph Castrogiovanni  33 y.o. male  PRE-OPERATIVE DIAGNOSIS:   1. OPEN FASCIOTOMIES RIGHT LEG 2. RIGHT TIBIA AND FIBULA FRACTURES  POST-OPERATIVE DIAGNOSIS:   1. OPEN FASCIOTOMIES RIGHT LEG 2. RIGHT TIBIA AND FIBULA FRACTURES  PROCEDURE:  Procedure(s): 1. IRRIGATION AND LAYERED CLOSURE OF WOUND RIGHT LEG (Right) 18CM 2. CLOSED REDUCTION AND CASTING OF RIGHT TIBIA AND FIBULA FRACTURES  SURGEON:  Surgeon(s) and Role:    * Budd PalmerMichael H Arshad Oberholzer, MD - Primary  PHYSICIAN ASSISTANT: Montez MoritaKeith Paul, PA-C  ANESTHESIA:   general  I/O:     SPECIMEN:  No Specimen  TOURNIQUET:  * No tourniquets in log *  DICTATION: .Other Dictation: Dictation Number [id number]

## 2014-01-30 ENCOUNTER — Encounter (HOSPITAL_COMMUNITY): Payer: Self-pay | Admitting: Orthopedic Surgery

## 2014-01-30 DIAGNOSIS — T07XXXA Unspecified multiple injuries, initial encounter: Secondary | ICD-10-CM | POA: Diagnosis present

## 2014-01-30 DIAGNOSIS — D62 Acute posthemorrhagic anemia: Secondary | ICD-10-CM | POA: Diagnosis not present

## 2014-01-30 DIAGNOSIS — S0181XA Laceration without foreign body of other part of head, initial encounter: Secondary | ICD-10-CM

## 2014-01-30 DIAGNOSIS — T79A0XA Compartment syndrome, unspecified, initial encounter: Secondary | ICD-10-CM

## 2014-01-30 MED ORDER — HYDROMORPHONE HCL PF 1 MG/ML IJ SOLN
0.5000 mg | INTRAMUSCULAR | Status: DC | PRN
  Administered 2014-02-01 – 2014-02-02 (×2): 0.5 mg via INTRAVENOUS
  Filled 2014-01-30 (×2): qty 1

## 2014-01-30 MED ORDER — OXYCODONE HCL 5 MG PO TABS
10.0000 mg | ORAL_TABLET | ORAL | Status: DC | PRN
  Administered 2014-01-30 (×2): 20 mg via ORAL
  Administered 2014-01-30: 10 mg via ORAL
  Administered 2014-01-31: 20 mg via ORAL
  Administered 2014-01-31 (×3): 10 mg via ORAL
  Administered 2014-01-31: 20 mg via ORAL
  Administered 2014-01-31: 10 mg via ORAL
  Administered 2014-02-01 (×2): 15 mg via ORAL
  Administered 2014-02-01 (×2): 20 mg via ORAL
  Administered 2014-02-01: 10 mg via ORAL
  Administered 2014-02-01: 15 mg via ORAL
  Administered 2014-02-02 – 2014-02-03 (×9): 20 mg via ORAL
  Filled 2014-01-30 (×2): qty 4
  Filled 2014-01-30 (×2): qty 2
  Filled 2014-01-30 (×3): qty 4
  Filled 2014-01-30: qty 3
  Filled 2014-01-30 (×4): qty 4
  Filled 2014-01-30: qty 2
  Filled 2014-01-30: qty 4
  Filled 2014-01-30: qty 2
  Filled 2014-01-30: qty 4
  Filled 2014-01-30: qty 3
  Filled 2014-01-30 (×2): qty 4
  Filled 2014-01-30: qty 2
  Filled 2014-01-30: qty 3
  Filled 2014-01-30 (×2): qty 4
  Filled 2014-01-30: qty 2

## 2014-01-30 MED ORDER — BACITRACIN-NEOMYCIN-POLYMYXIN OINTMENT TUBE
1.0000 "application " | TOPICAL_OINTMENT | Freq: Two times a day (BID) | CUTANEOUS | Status: DC
  Administered 2014-01-30 – 2014-02-03 (×7): 1 via TOPICAL
  Filled 2014-01-30: qty 1
  Filled 2014-01-30: qty 15
  Filled 2014-01-30: qty 1
  Filled 2014-01-30 (×2): qty 15

## 2014-01-30 MED ORDER — TRAMADOL HCL 50 MG PO TABS
100.0000 mg | ORAL_TABLET | Freq: Four times a day (QID) | ORAL | Status: DC
  Administered 2014-01-30 – 2014-02-03 (×18): 100 mg via ORAL
  Filled 2014-01-30 (×18): qty 2

## 2014-01-30 MED ORDER — HYDROXYZINE HCL 25 MG PO TABS
25.0000 mg | ORAL_TABLET | Freq: Three times a day (TID) | ORAL | Status: DC | PRN
  Administered 2014-01-31 (×2): 25 mg via ORAL
  Filled 2014-01-30 (×2): qty 1

## 2014-01-30 NOTE — Discharge Instructions (Signed)
Orthopaedic Trauma Service Discharge Instructions   General Discharge Instructions  WEIGHT BEARING STATUS: Nonweightbearing Right leg   STOP SMOKING OR USING NICOTINE PRODUCTS!!!!  As discussed nicotine severely impairs your body's ability to heal surgical and traumatic wounds but also impairs bone healing.  Wounds and bone heal by forming microscopic blood vessels (angiogenesis) and nicotine is a vasoconstrictor (essentially, shrinks blood vessels).  Therefore, if vasoconstriction occurs to these microscopic blood vessels they essentially disappear and are unable to deliver necessary nutrients to the healing tissue.  This is one modifiable factor that you can do to dramatically increase your chances of healing your injury.    (This means no smoking, no nicotine gum, patches, etc)  DO NOT USE NONSTEROIDAL ANTI-INFLAMMATORY DRUGS (NSAID'S)  Using products such as Advil (ibuprofen), Aleve (naproxen), Motrin (ibuprofen) for additional pain control during fracture healing can delay and/or prevent the healing response.  If you would like to take over the counter (OTC) medication, Tylenol (acetaminophen) is ok.  However, some narcotic medications that are given for pain control contain acetaminophen as well. Therefore, you should not exceed more than 4000 mg of tylenol in a day if you do not have liver disease.  Also note that there are may OTC medicines, such as cold medicines and allergy medicines that my contain tylenol as well.  If you have any questions about medications and/or interactions please ask your doctor/PA or your pharmacist.   PAIN MEDICATION USE AND EXPECTATIONS  You have likely been given narcotic medications to help control your pain.  After a traumatic event that results in an fracture (broken bone) with or without surgery, it is ok to use narcotic pain medications to help control one's pain.  We understand that everyone responds to pain differently and each individual patient will be  evaluated on a regular basis for the continued need for narcotic medications. Ideally, narcotic medication use should last no more than 6-8 weeks (coinciding with fracture healing).   As a patient it is your responsibility as well to monitor narcotic medication use and report the amount and frequency you use these medications when you come to your office visit.   We would also advise that if you are using narcotic medications, you should take a dose prior to therapy to maximize you participation.  IF YOU ARE ON NARCOTIC MEDICATIONS IT IS NOT PERMISSIBLE TO OPERATE A MOTOR VEHICLE (MOTORCYCLE/CAR/TRUCK/MOPED) OR HEAVY MACHINERY DO NOT MIX NARCOTICS WITH OTHER CNS (CENTRAL NERVOUS SYSTEM) DEPRESSANTS SUCH AS ALCOHOL       ICE AND ELEVATE INJURED/OPERATIVE EXTREMITY  Using ice and elevating the injured extremity above your heart can help with swelling and pain control.  Icing in a pulsatile fashion, such as 20 minutes on and 20 minutes off, can be followed.    Do not place ice directly on skin. Make sure there is a barrier between to skin and the ice pack.    Using frozen items such as frozen peas works well as the conform nicely to the are that needs to be iced.  USE AN ACE WRAP OR TED HOSE FOR SWELLING CONTROL  In addition to icing and elevation, Ace wraps or TED hose are used to help limit and resolve swelling.  It is recommended to use Ace wraps or TED hose until you are informed to stop.    When using Ace Wraps start the wrapping distally (farthest away from the body) and wrap proximally (closer to the body)   Example: If you had surgery  on your leg or thing and you do not have a splint on, start the ace wrap at the toes and work your way up to the thigh        If you had surgery on your upper extremity and do not have a splint on, start the ace wrap at your fingers and work your way up to the upper arm  IF YOU ARE IN A SPLINT OR CAST DO NOT REMOVE IT FOR ANY REASON   If your splint gets wet  for any reason please contact the office immediately. You may shower in your splint or cast as long as you keep it dry.  This can be done by wrapping in a cast cover or garbage back (or similar)  Do Not stick any thing down your splint or cast such as pencils, money, or hangers to try and scratch yourself with.  If you feel itchy take benadryl as prescribed on the bottle for itching  IF YOU ARE IN A CAM BOOT (BLACK BOOT)  You may remove boot periodically. Perform daily dressing changes as noted below.  Wash the liner of the boot regularly and wear a sock when wearing the boot. It is recommended that you sleep in the boot until told otherwise  CALL THE OFFICE WITH ANY QUESTIONS OR CONCERTS: [phone]
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
## 2014-01-30 NOTE — Progress Notes (Addendum)
PT Cancellation Note  Patient Details Name: Vale HavenJoseph Kittle MRN: [id number] DOB: 11-07-1980   Cancelled Treatment:    Reason Eval/Treat Not Completed: Pain limiting ability to participate (Attempted PT this morning at 11:10, pt c/o pain. RN notified, pt received pain medicine. Attempted again at 12:35, pt refused PT due to pain. RN aware. )  PT now recommending SNF, pt stated he will not have assistance at home.    Ralene BatheUhlenberg, Zephan Beauchaine Kistler 01/30/2014, 12:45 PM [phone]
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
## 2014-01-30 NOTE — Anesthesia Postprocedure Evaluation (Signed)
  Anesthesia Post-op Note  Patient: [name]  Procedure(s) Performed: Procedure(s): IRRIGATION AND DEBRIDEMENT EXTREMITY/CLOSURE OF WOUND RIGHT LEG (Right)  Patient Location: PACU  Anesthesia Type:General  Level of Consciousness: awake, alert , oriented and patient cooperative  Airway and Oxygen Therapy: Patient Spontanous Breathing  Post-op Pain: mild  Post-op Assessment: Post-op Vital signs reviewed, Patient's Cardiovascular Status Stable, Respiratory Function Stable, Patent Airway and No signs of Nausea or vomiting  Post-op Vital Signs: stable  Last Vitals:  Filed Vitals:   01/30/14 0610  BP: 109/63  Pulse: 115  Temp: 36.8 C  Resp: 16    Complications: No apparent anesthesia complications
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
## 2014-01-30 NOTE — Progress Notes (Signed)
CSW met with pt/pt.'s parents and CM, Sandi Mariscal. It is pt.'s desire upon discharge to discharge to a SNF for rehabilitation. Pt does not have a facility choice, asked CSW to send FL2 out to facilities in the [place] area. CSW will complete forms upon completion of PT update. CSW will continue to follow.   [address], [place]
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
## 2014-01-31 NOTE — Progress Notes (Signed)
I have seen and examined the patient and agree with the assessment and plans. SNF when available   Glenda Spelman A. Magnus IvanBlackman  MD, FACS

## 2014-02-01 NOTE — Progress Notes (Signed)
Clinical Social Work Department BRIEF PSYCHOSOCIAL ASSESSMENT 02/01/2014  Patient:  [name], [name]     Account Number:  [id number]     Admit date:  01/26/2014  Clinical Social Worker:  Rolinda Roan  Date/Time:  02/01/2014 06:48 PM  Referred by:  Physician  Date Referred:  02/01/2014 Referred for  SNF Placement   Other Referral:   Interview type:  Patient Other interview type:    PSYCHOSOCIAL DATA Living Status:  FRIEND(S) Admitted from facility:   Level of care:   Primary support name:  [name] Primary support relationship to patient:  PARENT Degree of support available:   Good support.    CURRENT CONCERNS  Other Concerns:    SOCIAL WORK ASSESSMENT / PLAN Clinical Social Worker (CSW) met with patient to discuss D/C plan. Patient reported that he lives in [place] with his roommates. Patient is agreeable to SNF search in [facility]. Patient does not have a preference of facilities at this time.   Assessment/plan status:  Psychosocial Support/Ongoing Assessment of Needs Other assessment/ plan:   Information/referral to community resources:   CSW gave patient SNF list.    PATIENT'S/FAMILY'S RESPONSE TO PLAN OF CARE: Patient thanked CSW for visit.
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
## 2014-02-01 NOTE — Progress Notes (Signed)
Clinical Social Work Department CLINICAL SOCIAL WORK PLACEMENT NOTE 02/01/2014  Patient:  [name],[name]  Account Number:  [id number] Admit date:  01/26/2014  Clinical Social Worker:  Samuella BruinKRISTIN Norita Meigs, Theresia MajorsLCSWA  Date/time:  02/01/2014 06:42 PM  Clinical Social Work is seeking post-discharge placement for this patient at the following level of care:   SKILLED NURSING   (*CSW will update this form in Epic as items are completed)   02/01/2014  Patient/family provided with [facility] Department of Clinical Social Work's list of facilities offering this level of care within the geographic area requested by the patient (or if unable, by the patient's family).  02/01/2014  Patient/family informed of their freedom to choose among providers that offer the needed level of care, that participate in Medicare, Medicaid or managed care program needed by the patient, have an available bed and are willing to accept the patient.    Patient/family informed of [facility]' ownership interest in [facility], as well as of the fact that they are under no obligation to receive care at this facility.  PASARR submitted to EDS on 02/01/2014 PASARR number received on 02/01/2014  FL2 transmitted to all facilities in geographic area requested by pt/family on  02/01/2014 FL2 transmitted to all facilities within larger geographic area on   Patient informed that his/her managed care company has contracts with or will negotiate with  certain facilities, including the following:     Patient/family informed of bed offers received:   Patient chooses bed at  Physician recommends and patient chooses bed at    Patient to be transferred to  on   Patient to be transferred to facility by  Patient and family notified of transfer on  Name of family member notified:    The following physician request were entered in Epic:   Additional Comments:  Samuella BruinKristin Damaria Vachon, MSW, LCSWA Clinical Social Worker [facility]  Emergency Dept. [phone]

## 2014-02-02 MED ORDER — OXYCODONE HCL 10 MG PO TABS: 10.0000 mg | ORAL_TABLET | ORAL | Status: DC | PRN

## 2014-02-02 MED ORDER — HYDROXYZINE HCL 25 MG PO TABS: 25.0000 mg | ORAL_TABLET | Freq: Three times a day (TID) | ORAL | Status: DC | PRN

## 2014-02-02 MED ORDER — BACITRACIN-NEOMYCIN-POLYMYXIN OINTMENT TUBE: 1.0000 "application " | TOPICAL_OINTMENT | Freq: Two times a day (BID) | CUTANEOUS | Status: AC

## 2014-02-02 MED ORDER — TRAMADOL HCL 50 MG PO TABS: 100.0000 mg | ORAL_TABLET | Freq: Four times a day (QID) | ORAL | Status: DC

## 2014-02-02 MED ORDER — POLYETHYLENE GLYCOL 3350 17 G PO PACK: 17.0000 g | PACK | Freq: Every day | ORAL | Status: DC

## 2014-02-02 NOTE — Progress Notes (Signed)
Placement into SNF.  No acute problems.  This patient has been seen and I agree with the findings and treatment plan.  Marta LamasJames O. Gae BonWyatt, III, MD, FACS [phone] (pager) [phone] (direct pager) Trauma Surgeon
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
## 2014-02-02 NOTE — Progress Notes (Addendum)
CSW spoke with pt and shared with pt that he has 3 bed offer, pt  made the decision to accept [facility]. [facility] reported that they have been runing pt.'s insurance and it is coming back as invalid. CSW spoke with pt who reported his benefits with Social Security has been halted due to an audit. Pt shared with CSW that  Martin MajesticCongressman Alma Adams [phone] is working on this case. CSW called Pearlean BrownieCongressman Adam's office who reported that they just received a call from the [place] family on 01/30/14 regarding this matter. CSW shared this information with CM, Carlyle LipaMichelle Bryson and Wandra MannanZack Brooks, AD SW. AD SW reported that we may be able to do a letter of [facility] [place]. CSW faxed FL2 and clinicals over for review. Will notify pt. CSW will continue to follow.   Scotti Kosta [employer] Computer[employer].LCWSA [phone]

## 2014-02-02 NOTE — Progress Notes (Signed)
PT Cancellation Note  Patient Details Name: [name] MRN: [id number] DOB: 30-May-1981   Cancelled Treatment:    Reason Eval/Treat Not Completed: Patient declined, no reason specified Pt politely declines PT session. Reports he is awaiting transfer to SNF. Will continue to follow until d/c.  [address] [place], [place] [phone]   Berton Mount[place], [name] 02/02/2014, 2:42 PM

## 2014-02-03 MED ORDER — OXYCODONE HCL ER 10 MG PO T12A
10.0000 mg | EXTENDED_RELEASE_TABLET | Freq: Two times a day (BID) | ORAL | Status: DC
  Administered 2014-02-03: 10 mg via ORAL
  Filled 2014-02-03: qty 1

## 2014-02-03 MED ORDER — OXYCODONE HCL ER 10 MG PO T12A: 10.0000 mg | EXTENDED_RELEASE_TABLET | Freq: Two times a day (BID) | ORAL | Status: DC

## 2014-02-03 NOTE — Progress Notes (Signed)
Patient d/c to SNF, report called.

## 2014-02-03 NOTE — Progress Notes (Signed)
CSW placed FL2 on pt.'s chart for signature. [facility]. CSW will continue to follow.   Howard PouchDoris Uzziel Russey,lCSWA [phone]

## 2014-02-03 NOTE — Progress Notes (Signed)
Signed FL2 Patient examined and I agree with the assessment and plan  Violeta GelinasBurke Lycan Davee, MD, MPH, FACS Trauma: [phone] General Surgery: [phone]  02/03/2014 10:27 AM

## 2014-02-03 NOTE — Progress Notes (Signed)
Cast fitting well. Agree with above.  Myrene GalasMichael Aquan Kope, MD [facility] [phone] (p)
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
## 2014-02-03 NOTE — Discharge Summary (Signed)
Troy Pauli, MD, MPH, FACS Trauma: [phone] General Surgery: [phone]  
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
## 2014-02-03 NOTE — Progress Notes (Addendum)
[facility] [place]. CSW will complete Medicaid application with pt and mail to DSS. CSW will send over Letter of Guarantee for placement. Trauma physician and CM, Carlyle LipaMichelle Bryson made aware. Pt will be discharged today. Pt made aware and is agreeable with plan. PTAR arranged for 3:00-3:30 pm transport.   [address]rene [place], [place] [phone]

## 2014-03-10 ENCOUNTER — Other Ambulatory Visit: Payer: Self-pay | Admitting: Family Medicine

## 2014-04-02 ENCOUNTER — Emergency Department (HOSPITAL_COMMUNITY)
Admission: EM | Admit: 2014-04-02 | Discharge: 2014-04-02 | Disposition: A | Discharge status: Home / Self Care | Payer: Medicare Other | Attending: Emergency Medicine | Admitting: Emergency Medicine

## 2014-04-02 ENCOUNTER — Encounter (HOSPITAL_COMMUNITY): Payer: Self-pay | Admitting: Emergency Medicine

## 2014-04-02 ENCOUNTER — Emergency Department (HOSPITAL_COMMUNITY): Payer: Medicare Other

## 2014-04-02 DIAGNOSIS — F411 Generalized anxiety disorder: Secondary | ICD-10-CM | POA: Insufficient documentation

## 2014-04-02 DIAGNOSIS — S8990XA Unspecified injury of unspecified lower leg, initial encounter: Secondary | ICD-10-CM | POA: Insufficient documentation

## 2014-04-02 DIAGNOSIS — Z8739 Personal history of other diseases of the musculoskeletal system and connective tissue: Secondary | ICD-10-CM | POA: Insufficient documentation

## 2014-04-02 DIAGNOSIS — S99919A Unspecified injury of unspecified ankle, initial encounter: Secondary | ICD-10-CM | POA: Diagnosis present

## 2014-04-02 DIAGNOSIS — Z8701 Personal history of pneumonia (recurrent): Secondary | ICD-10-CM | POA: Diagnosis not present

## 2014-04-02 DIAGNOSIS — Y9289 Other specified places as the place of occurrence of the external cause: Secondary | ICD-10-CM | POA: Insufficient documentation

## 2014-04-02 DIAGNOSIS — Z8719 Personal history of other diseases of the digestive system: Secondary | ICD-10-CM | POA: Insufficient documentation

## 2014-04-02 DIAGNOSIS — F17200 Nicotine dependence, unspecified, uncomplicated: Secondary | ICD-10-CM | POA: Insufficient documentation

## 2014-04-02 DIAGNOSIS — Z880 Allergy status to penicillin: Secondary | ICD-10-CM | POA: Diagnosis not present

## 2014-04-02 DIAGNOSIS — M25571 Pain in right ankle and joints of right foot: Secondary | ICD-10-CM

## 2014-04-02 DIAGNOSIS — S99929A Unspecified injury of unspecified foot, initial encounter: Principal | ICD-10-CM

## 2014-04-02 DIAGNOSIS — W230XXA Caught, crushed, jammed, or pinched between moving objects, initial encounter: Secondary | ICD-10-CM | POA: Insufficient documentation

## 2014-04-02 DIAGNOSIS — Z79899 Other long term (current) drug therapy: Secondary | ICD-10-CM | POA: Diagnosis not present

## 2014-04-02 DIAGNOSIS — Z8669 Personal history of other diseases of the nervous system and sense organs: Secondary | ICD-10-CM | POA: Diagnosis not present

## 2014-04-02 DIAGNOSIS — Y9389 Activity, other specified: Secondary | ICD-10-CM | POA: Insufficient documentation

## 2014-04-02 MED ORDER — OXYCODONE-ACETAMINOPHEN 5-325 MG PO TABS: 1.0000 | ORAL_TABLET | Freq: Four times a day (QID) | ORAL | Status: DC | PRN

## 2014-04-02 MED ORDER — OXYCODONE-ACETAMINOPHEN 5-325 MG PO TABS
2.0000 | ORAL_TABLET | Freq: Once | ORAL | Status: AC
  Administered 2014-04-02: 2 via ORAL
  Filled 2014-04-02: qty 2

## 2014-04-02 NOTE — ED Notes (Signed)
Bed: WA05 Expected date:  Expected time:  Means of arrival:  Comments: Ankle pain 
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
## 2014-04-07 ENCOUNTER — Emergency Department (HOSPITAL_COMMUNITY): Payer: Medicare Other

## 2014-04-07 ENCOUNTER — Encounter (HOSPITAL_COMMUNITY): Payer: Self-pay | Admitting: Emergency Medicine

## 2014-04-07 ENCOUNTER — Emergency Department (HOSPITAL_COMMUNITY)
Admission: EM | Admit: 2014-04-07 | Discharge: 2014-04-07 | Disposition: A | Discharge status: Home / Self Care | Payer: Medicare Other | Attending: Emergency Medicine | Admitting: Emergency Medicine

## 2014-04-07 DIAGNOSIS — Z8739 Personal history of other diseases of the musculoskeletal system and connective tissue: Secondary | ICD-10-CM | POA: Diagnosis not present

## 2014-04-07 DIAGNOSIS — S99929A Unspecified injury of unspecified foot, initial encounter: Secondary | ICD-10-CM | POA: Diagnosis present

## 2014-04-07 DIAGNOSIS — F329 Major depressive disorder, single episode, unspecified: Secondary | ICD-10-CM | POA: Diagnosis not present

## 2014-04-07 DIAGNOSIS — W050XXA Fall from non-moving wheelchair, initial encounter: Secondary | ICD-10-CM | POA: Diagnosis not present

## 2014-04-07 DIAGNOSIS — Y929 Unspecified place or not applicable: Secondary | ICD-10-CM | POA: Insufficient documentation

## 2014-04-07 DIAGNOSIS — Z880 Allergy status to penicillin: Secondary | ICD-10-CM | POA: Insufficient documentation

## 2014-04-07 DIAGNOSIS — Z8701 Personal history of pneumonia (recurrent): Secondary | ICD-10-CM | POA: Diagnosis not present

## 2014-04-07 DIAGNOSIS — Z8669 Personal history of other diseases of the nervous system and sense organs: Secondary | ICD-10-CM | POA: Insufficient documentation

## 2014-04-07 DIAGNOSIS — S8990XA Unspecified injury of unspecified lower leg, initial encounter: Secondary | ICD-10-CM | POA: Insufficient documentation

## 2014-04-07 DIAGNOSIS — Z8719 Personal history of other diseases of the digestive system: Secondary | ICD-10-CM | POA: Insufficient documentation

## 2014-04-07 DIAGNOSIS — Z79899 Other long term (current) drug therapy: Secondary | ICD-10-CM | POA: Insufficient documentation

## 2014-04-07 DIAGNOSIS — S99919A Unspecified injury of unspecified ankle, initial encounter: Principal | ICD-10-CM

## 2014-04-07 DIAGNOSIS — M25571 Pain in right ankle and joints of right foot: Secondary | ICD-10-CM

## 2014-04-07 DIAGNOSIS — G40909 Epilepsy, unspecified, not intractable, without status epilepticus: Secondary | ICD-10-CM | POA: Diagnosis not present

## 2014-04-07 DIAGNOSIS — F3289 Other specified depressive episodes: Secondary | ICD-10-CM | POA: Insufficient documentation

## 2014-04-07 DIAGNOSIS — F17200 Nicotine dependence, unspecified, uncomplicated: Secondary | ICD-10-CM | POA: Insufficient documentation

## 2014-04-07 DIAGNOSIS — Y9389 Activity, other specified: Secondary | ICD-10-CM | POA: Diagnosis not present

## 2014-04-07 DIAGNOSIS — F411 Generalized anxiety disorder: Secondary | ICD-10-CM | POA: Insufficient documentation

## 2014-04-07 MED ORDER — OXYCODONE-ACETAMINOPHEN 5-325 MG PO TABS
1.0000 | ORAL_TABLET | ORAL | Status: DC | PRN
Start: 2014-04-07 — End: 2014-11-02

## 2014-04-07 MED ORDER — ONDANSETRON 4 MG PO TBDP
4.0000 mg | ORAL_TABLET | Freq: Once | ORAL | Status: DC
  Filled 2014-04-07: qty 1

## 2014-04-07 MED ORDER — CLINDAMYCIN HCL 150 MG PO CAPS: 300.0000 mg | ORAL_CAPSULE | Freq: Three times a day (TID) | ORAL | Status: DC

## 2014-04-07 MED ORDER — OXYCODONE-ACETAMINOPHEN 5-325 MG PO TABS
2.0000 | ORAL_TABLET | Freq: Once | ORAL | Status: AC
  Administered 2014-04-07: 2 via ORAL
  Filled 2014-04-07: qty 2

## 2014-04-07 NOTE — ED Provider Notes (Signed)
  This was a shared visit with a mid-level provided (NP or PA).  Throughout the patient's course I was available for consultation/collaboration.  On my exam the patient was in no distress.  However, he had a warm, slightly enlarged calf, near an area of poorly healing prior fracture. Patient was HD stable. Patient was d/c in stable condition.       Gerhard Munch, MD 04/07/14 [phone]

## 2014-04-07 NOTE — Discharge Instructions (Signed)
Ankle Pain Ankle pain is a common symptom. The bones, cartilage, tendons, and muscles of the ankle joint perform a lot of work each day. The ankle joint holds your body weight and allows you to move around. Ankle pain can occur on either side or back of 1 or both ankles. Ankle pain may be sharp and burning or dull and aching. There may be tenderness, stiffness, redness, or warmth around the ankle. The pain occurs more often when a person walks or puts pressure on the ankle. CAUSES  There are many reasons ankle pain can develop. It is important to work with your caregiver to identify the cause since many conditions can impact the bones, cartilage, muscles, and tendons. Causes for ankle pain include:  Injury, including a break (fracture), sprain, or strain often due to a fall, sports, or a high-impact activity.  Swelling (inflammation) of a tendon (tendonitis).  Achilles tendon rupture.  Ankle instability after repeated sprains and strains.  Poor foot alignment.  Pressure on a nerve (tarsal tunnel syndrome).  Arthritis in the ankle or the lining of the ankle.  Crystal formation in the ankle (gout or pseudogout). DIAGNOSIS  A diagnosis is based on your medical history, your symptoms, results of your physical exam, and results of diagnostic tests. Diagnostic tests may include X-ray exams or a computerized magnetic scan (magnetic resonance imaging, MRI). TREATMENT  Treatment will depend on the cause of your ankle pain and may include:  Keeping pressure off the ankle and limiting activities.  Using crutches or other walking support (a cane or brace).  Using rest, ice, compression, and elevation.  Participating in physical therapy or home exercises.  Wearing shoe inserts or special shoes.  Losing weight.  Taking medications to reduce pain or swelling or receiving an injection.  Undergoing surgery. HOME CARE INSTRUCTIONS   Only take over-the-counter or prescription medicines for  pain, discomfort, or fever as directed by your caregiver.  Put ice on the injured area.  Put ice in a plastic bag.  Place a towel between your skin and the bag.  Leave the ice on for 15-20 minutes at a time, 03-04 times a day.  Keep your leg raised (elevated) when possible to lessen swelling.  Avoid activities that cause ankle pain.  Follow specific exercises as directed by your caregiver.  Record how often you have ankle pain, the location of the pain, and what it feels like. This information may be helpful to you and your caregiver.  Ask your caregiver about returning to work or sports and whether you should drive.  Follow up with your caregiver for further examination, therapy, or testing as directed. SEEK MEDICAL CARE IF:   Pain or swelling continues or worsens beyond 1 week.  You have an oral temperature above 102 F (38.9 C).  You are feeling unwell or have chills.  You are having an increasingly difficult time with walking.  You have loss of sensation or other new symptoms.  You have questions or concerns. MAKE SURE YOU:   Understand these instructions.  Will watch your condition.  Will get help right away if you are not doing well or get worse. Document Released: 12/28/2009 Document Revised: 10/02/2011 Document Reviewed: 12/28/2009 [facility] Patient Information 2015 [place], [place]. This information is not intended to replace advice given to you by your health care provider. Make sure you discuss any questions you have with your health care provider. Cellulitis Cellulitis is an infection of the skin and the tissue beneath it. The  infected area is usually red and tender. Cellulitis occurs most often in the arms and lower legs.  CAUSES  Cellulitis is caused by bacteria that enter the skin through cracks or cuts in the skin. The most common types of bacteria that cause cellulitis are staphylococci and streptococci. SIGNS AND SYMPTOMS   Redness and  warmth.  Swelling.  Tenderness or pain.  Fever. DIAGNOSIS  Your health care provider can usually determine what is wrong based on a physical exam. Blood tests may also be done. TREATMENT  Treatment usually involves taking an antibiotic medicine. HOME CARE INSTRUCTIONS   Take your antibiotic medicine as directed by your health care provider. Finish the antibiotic even if you start to feel better.  Keep the infected arm or leg elevated to reduce swelling.  Apply a warm cloth to the affected area up to 4 times per day to relieve pain.  Take medicines only as directed by your health care provider.  Keep all follow-up visits as directed by your health care provider. SEEK MEDICAL CARE IF:   You notice red streaks coming from the infected area.  Your red area gets larger or turns dark in color.  Your bone or joint underneath the infected area becomes painful after the skin has healed.  Your infection returns in the same area or another area.  You notice a swollen bump in the infected area.  You develop new symptoms.  You have a fever. SEEK IMMEDIATE MEDICAL CARE IF:   You feel very sleepy.  You develop vomiting or diarrhea.  You have a general ill feeling (malaise) with muscle aches and pains. MAKE SURE YOU:   Understand these instructions.  Will watch your condition.  Will get help right away if you are not doing well or get worse. Document Released: 04/19/2005 Document Revised: 11/24/2013 Document Reviewed: 09/25/2011 [facility] Patient Information 2015 [place], [place]. This information is not intended to replace advice given to you by your health care provider. Make sure you discuss any questions you have with your health care provider.
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
## 2014-06-06 ENCOUNTER — Encounter (HOSPITAL_COMMUNITY): Payer: Self-pay

## 2014-06-06 ENCOUNTER — Emergency Department (HOSPITAL_COMMUNITY)
Admission: EM | Admit: 2014-06-06 | Discharge: 2014-06-06 | Disposition: A | Discharge status: Home / Self Care | Payer: Medicare Other | Attending: Emergency Medicine | Admitting: Emergency Medicine

## 2014-06-06 DIAGNOSIS — Y9389 Activity, other specified: Secondary | ICD-10-CM | POA: Insufficient documentation

## 2014-06-06 DIAGNOSIS — Z8719 Personal history of other diseases of the digestive system: Secondary | ICD-10-CM | POA: Diagnosis not present

## 2014-06-06 DIAGNOSIS — W228XXA Striking against or struck by other objects, initial encounter: Secondary | ICD-10-CM | POA: Diagnosis not present

## 2014-06-06 DIAGNOSIS — F329 Major depressive disorder, single episode, unspecified: Secondary | ICD-10-CM | POA: Insufficient documentation

## 2014-06-06 DIAGNOSIS — Z8701 Personal history of pneumonia (recurrent): Secondary | ICD-10-CM | POA: Insufficient documentation

## 2014-06-06 DIAGNOSIS — G40409 Other generalized epilepsy and epileptic syndromes, not intractable, without status epilepticus: Secondary | ICD-10-CM | POA: Diagnosis not present

## 2014-06-06 DIAGNOSIS — Z79899 Other long term (current) drug therapy: Secondary | ICD-10-CM | POA: Insufficient documentation

## 2014-06-06 DIAGNOSIS — Y9289 Other specified places as the place of occurrence of the external cause: Secondary | ICD-10-CM | POA: Insufficient documentation

## 2014-06-06 DIAGNOSIS — Z880 Allergy status to penicillin: Secondary | ICD-10-CM | POA: Insufficient documentation

## 2014-06-06 DIAGNOSIS — Y998 Other external cause status: Secondary | ICD-10-CM | POA: Insufficient documentation

## 2014-06-06 DIAGNOSIS — Z792 Long term (current) use of antibiotics: Secondary | ICD-10-CM | POA: Insufficient documentation

## 2014-06-06 DIAGNOSIS — Z720 Tobacco use: Secondary | ICD-10-CM | POA: Diagnosis not present

## 2014-06-06 DIAGNOSIS — S01111A Laceration without foreign body of right eyelid and periocular area, initial encounter: Secondary | ICD-10-CM | POA: Insufficient documentation

## 2014-06-06 DIAGNOSIS — F419 Anxiety disorder, unspecified: Secondary | ICD-10-CM | POA: Insufficient documentation

## 2014-06-06 DIAGNOSIS — S0181XA Laceration without foreign body of other part of head, initial encounter: Secondary | ICD-10-CM

## 2014-06-06 DIAGNOSIS — Z8776 Personal history of (corrected) congenital malformations of integument, limbs and musculoskeletal system: Secondary | ICD-10-CM | POA: Insufficient documentation

## 2014-06-06 DIAGNOSIS — W19XXXA Unspecified fall, initial encounter: Secondary | ICD-10-CM

## 2014-06-06 MED ORDER — WHITE PETROLATUM GEL
Status: DC | PRN
  Filled 2014-06-06: qty 5

## 2014-06-06 NOTE — Discharge Instructions (Signed)
Facial Laceration ° A facial laceration is a cut on the face. These injuries can be painful and cause bleeding. Lacerations usually heal quickly, but they need special care to reduce scarring. °DIAGNOSIS  °Your health care provider will take a medical history, ask for details about how the injury occurred, and examine the wound to determine how deep the cut is. °TREATMENT  °Some facial lacerations may not require closure. Others may not be able to be closed because of an increased risk of infection. The risk of infection and the chance for successful closure will depend on various factors, including the amount of time since the injury occurred. °The wound may be cleaned to help prevent infection. If closure is appropriate, pain medicines may be given if needed. Your health care provider will use stitches (sutures), wound glue (adhesive), or skin adhesive strips to repair the laceration. These tools bring the skin edges together to allow for faster healing and a better cosmetic outcome. If needed, you may also be given a tetanus shot. °HOME CARE INSTRUCTIONS °· Only take over-the-counter or prescription medicines as directed by your health care provider. °· Follow your health care provider's instructions for wound care. These instructions will vary depending on the technique used for closing the wound. °For Sutures: °· Keep the wound clean and dry.   °· If you were given a bandage (dressing), you should change it at least once a day. Also change the dressing if it becomes wet or dirty, or as directed by your health care provider.   °· Wash the wound with soap and water 2 times a day. Rinse the wound off with water to remove all soap. Pat the wound dry with a clean towel.   °· After cleaning, apply a thin layer of the antibiotic ointment recommended by your health care provider. This will help prevent infection and keep the dressing from sticking.   °· You may shower as usual after the first 24 hours. Do not soak the  wound in water until the sutures are removed.   °· Get your sutures removed as directed by your health care provider. With facial lacerations, sutures should usually be taken out after 4-5 days to avoid stitch marks.   °· Wait a few days after your sutures are removed before applying any makeup. °For Skin Adhesive Strips: °· Keep the wound clean and dry.   °· Do not get the skin adhesive strips wet. You may bathe carefully, using caution to keep the wound dry.   °· If the wound gets wet, pat it dry with a clean towel.   °· Skin adhesive strips will fall off on their own. You may trim the strips as the wound heals. Do not remove skin adhesive strips that are still stuck to the wound. They will fall off in time.   °For Wound Adhesive: °· You may briefly wet your wound in the shower or bath. Do not soak or scrub the wound. Do not swim. Avoid periods of heavy sweating until the skin adhesive has fallen off on its own. After showering or bathing, gently pat the wound dry with a clean towel.   °· Do not apply liquid medicine, cream medicine, ointment medicine, or makeup to your wound while the skin adhesive is in place. This may loosen the film before your wound is healed.   °· If a dressing is placed over the wound, be careful not to apply tape directly over the skin adhesive. This may cause the adhesive to be pulled off before the wound is healed.   °· Avoid   prolonged exposure to sunlight or tanning lamps while the skin adhesive is in place.  The skin adhesive will usually remain in place for 5-10 days, then naturally fall off the skin. Do not pick at the adhesive film.  After Healing: Once the wound has healed, cover the wound with sunscreen during the day for 1 full year. This can help minimize scarring. Exposure to ultraviolet light in the first year will darken the scar. It can take 1-2 years for the scar to lose its redness and to heal completely.  SEEK IMMEDIATE MEDICAL CARE IF:  You have redness, pain, or  swelling around the wound.   You see ayellowish-white fluid (pus) coming from the wound.   You have chills or a fever.  MAKE SURE YOU:  Understand these instructions.  Will watch your condition.  Will get help right away if you are not doing well or get worse. Document Released: 08/17/2004 Document Revised: 04/30/2013 Document Reviewed: 02/20/2013 [facility] Patient Information 2015 [employer] [place]. This information is not intended to replace advice given to you by your health care provider. Make sure you discuss any questions you have with your health care provider. Your laceration was superficial and closed with tissue adhesive.  It will not need any further treatment
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
## 2014-11-01 ENCOUNTER — Emergency Department (HOSPITAL_COMMUNITY): Payer: No Typology Code available for payment source

## 2014-11-01 ENCOUNTER — Inpatient Hospital Stay (HOSPITAL_COMMUNITY)
Admission: EM | Admit: 2014-11-01 | Discharge: 2014-11-10 | DRG: 958 | Disposition: A | Discharge status: Skilled Nursing Facility | Payer: No Typology Code available for payment source | Attending: General Surgery | Admitting: General Surgery

## 2014-11-01 ENCOUNTER — Encounter (HOSPITAL_COMMUNITY): Payer: Self-pay | Admitting: Emergency Medicine

## 2014-11-01 DIAGNOSIS — S42301A Unspecified fracture of shaft of humerus, right arm, initial encounter for closed fracture: Secondary | ICD-10-CM

## 2014-11-01 DIAGNOSIS — S2500XA Unspecified injury of thoracic aorta, initial encounter: Secondary | ICD-10-CM | POA: Diagnosis present

## 2014-11-01 DIAGNOSIS — S069XAA Unspecified intracranial injury with loss of consciousness status unknown, initial encounter: Secondary | ICD-10-CM | POA: Diagnosis present

## 2014-11-01 DIAGNOSIS — F1290 Cannabis use, unspecified, uncomplicated: Secondary | ICD-10-CM | POA: Diagnosis present

## 2014-11-01 DIAGNOSIS — Z885 Allergy status to narcotic agent status: Secondary | ICD-10-CM

## 2014-11-01 DIAGNOSIS — R40241 Glasgow coma scale score 13-15: Secondary | ICD-10-CM | POA: Diagnosis present

## 2014-11-01 DIAGNOSIS — S270XXA Traumatic pneumothorax, initial encounter: Secondary | ICD-10-CM | POA: Diagnosis present

## 2014-11-01 DIAGNOSIS — Z881 Allergy status to other antibiotic agents status: Secondary | ICD-10-CM

## 2014-11-01 DIAGNOSIS — Z993 Dependence on wheelchair: Secondary | ICD-10-CM | POA: Diagnosis not present

## 2014-11-01 DIAGNOSIS — S01512A Laceration without foreign body of oral cavity, initial encounter: Secondary | ICD-10-CM | POA: Diagnosis present

## 2014-11-01 DIAGNOSIS — S37812A Contusion of adrenal gland, initial encounter: Secondary | ICD-10-CM | POA: Diagnosis present

## 2014-11-01 DIAGNOSIS — S065X9A Traumatic subdural hemorrhage with loss of consciousness of unspecified duration, initial encounter: Secondary | ICD-10-CM | POA: Diagnosis present

## 2014-11-01 DIAGNOSIS — S3500XA Unspecified injury of abdominal aorta, initial encounter: Secondary | ICD-10-CM | POA: Diagnosis present

## 2014-11-01 DIAGNOSIS — E559 Vitamin D deficiency, unspecified: Secondary | ICD-10-CM | POA: Diagnosis present

## 2014-11-01 DIAGNOSIS — R339 Retention of urine, unspecified: Secondary | ICD-10-CM | POA: Diagnosis present

## 2014-11-01 DIAGNOSIS — F329 Major depressive disorder, single episode, unspecified: Secondary | ICD-10-CM | POA: Diagnosis present

## 2014-11-01 DIAGNOSIS — R739 Hyperglycemia, unspecified: Secondary | ICD-10-CM | POA: Diagnosis present

## 2014-11-01 DIAGNOSIS — S2691XA Contusion of heart, unspecified with or without hemopericardium, initial encounter: Secondary | ICD-10-CM | POA: Diagnosis not present

## 2014-11-01 DIAGNOSIS — S069X9A Unspecified intracranial injury with loss of consciousness of unspecified duration, initial encounter: Secondary | ICD-10-CM | POA: Diagnosis present

## 2014-11-01 DIAGNOSIS — S42211A Unspecified displaced fracture of surgical neck of right humerus, initial encounter for closed fracture: Secondary | ICD-10-CM | POA: Diagnosis present

## 2014-11-01 DIAGNOSIS — S32009A Unspecified fracture of unspecified lumbar vertebra, initial encounter for closed fracture: Secondary | ICD-10-CM

## 2014-11-01 DIAGNOSIS — S42351A Displaced comminuted fracture of shaft of humerus, right arm, initial encounter for closed fracture: Secondary | ICD-10-CM | POA: Diagnosis present

## 2014-11-01 DIAGNOSIS — G40409 Other generalized epilepsy and epileptic syndromes, not intractable, without status epilepticus: Secondary | ICD-10-CM | POA: Diagnosis present

## 2014-11-01 DIAGNOSIS — D62 Acute posthemorrhagic anemia: Secondary | ICD-10-CM | POA: Diagnosis not present

## 2014-11-01 DIAGNOSIS — S0081XA Abrasion of other part of head, initial encounter: Secondary | ICD-10-CM | POA: Diagnosis present

## 2014-11-01 DIAGNOSIS — R0902 Hypoxemia: Secondary | ICD-10-CM

## 2014-11-01 DIAGNOSIS — Z452 Encounter for adjustment and management of vascular access device: Secondary | ICD-10-CM

## 2014-11-01 DIAGNOSIS — F17210 Nicotine dependence, cigarettes, uncomplicated: Secondary | ICD-10-CM | POA: Diagnosis present

## 2014-11-01 DIAGNOSIS — K219 Gastro-esophageal reflux disease without esophagitis: Secondary | ICD-10-CM | POA: Diagnosis present

## 2014-11-01 DIAGNOSIS — S32049A Unspecified fracture of fourth lumbar vertebra, initial encounter for closed fracture: Secondary | ICD-10-CM | POA: Diagnosis present

## 2014-11-01 DIAGNOSIS — S0101XA Laceration without foreign body of scalp, initial encounter: Secondary | ICD-10-CM | POA: Diagnosis present

## 2014-11-01 DIAGNOSIS — R0602 Shortness of breath: Secondary | ICD-10-CM

## 2014-11-01 DIAGNOSIS — S42363A Displaced segmental fracture of shaft of humerus, unspecified arm, initial encounter for closed fracture: Secondary | ICD-10-CM

## 2014-11-01 DIAGNOSIS — I779 Disorder of arteries and arterioles, unspecified: Secondary | ICD-10-CM

## 2014-11-01 DIAGNOSIS — Z419 Encounter for procedure for purposes other than remedying health state, unspecified: Secondary | ICD-10-CM

## 2014-11-01 DIAGNOSIS — S42201A Unspecified fracture of upper end of right humerus, initial encounter for closed fracture: Secondary | ICD-10-CM | POA: Diagnosis present

## 2014-11-01 DIAGNOSIS — J939 Pneumothorax, unspecified: Secondary | ICD-10-CM

## 2014-11-01 DIAGNOSIS — T148XXA Other injury of unspecified body region, initial encounter: Secondary | ICD-10-CM

## 2014-11-01 DIAGNOSIS — S069X2S Unspecified intracranial injury with loss of consciousness of 31 minutes to 59 minutes, sequela: Secondary | ICD-10-CM | POA: Diagnosis not present

## 2014-11-01 DIAGNOSIS — Q688 Other specified congenital musculoskeletal deformities: Secondary | ICD-10-CM

## 2014-11-01 LAB — COMPREHENSIVE METABOLIC PANEL
ALBUMIN: 4 g/dL (ref 3.5–5.2)
ALT: 151 U/L — AB (ref 0–53)
AST: 253 U/L — AB (ref 0–37)
Alkaline Phosphatase: 79 U/L (ref 39–117)
Anion gap: 11 (ref 5–15)
BUN: 7 mg/dL (ref 6–23)
CHLORIDE: 100 mmol/L (ref 96–112)
CO2: 26 mmol/L (ref 19–32)
Calcium: 9.3 mg/dL (ref 8.4–10.5)
Creatinine, Ser: 0.52 mg/dL (ref 0.50–1.35)
GFR calc non Af Amer: >90 mL/min (ref >90)
Glucose, Bld: 165 mg/dL — ABNORMAL HIGH (ref 70–99)
POTASSIUM: 3.6 mmol/L (ref 3.5–5.1)
Sodium: 137 mmol/L (ref 135–145)
Total Bilirubin: 1 mg/dL (ref 0.3–1.2)
Total Protein: 6.5 g/dL (ref 6.0–8.3)

## 2014-11-01 LAB — I-STAT CG4 LACTIC ACID, ED: Lactic Acid, Venous: 2.76 mmol/L (ref 0.5–2.0)

## 2014-11-01 LAB — CBC WITH DIFFERENTIAL/PLATELET
Basophils Absolute: 0.1 10*3/uL (ref 0.0–0.1)
Basophils Relative: 0 % (ref 0–1)
EOS PCT: 0 % (ref 0–5)
Eosinophils Absolute: 0.1 10*3/uL (ref 0.0–0.7)
HEMATOCRIT: 43.2 % (ref 39.0–52.0)
HEMOGLOBIN: 15 g/dL (ref 13.0–17.0)
Lymphocytes Relative: 22 % (ref 12–46)
Lymphs Abs: 4.7 10*3/uL — ABNORMAL HIGH (ref 0.7–4.0)
MCH: 32.7 pg (ref 26.0–34.0)
MCHC: 34.7 g/dL (ref 30.0–36.0)
MCV: 94.1 fL (ref 78.0–100.0)
Monocytes Absolute: 1.3 10*3/uL — ABNORMAL HIGH (ref 0.1–1.0)
Monocytes Relative: 6 % (ref 3–12)
NEUTROS ABS: 15.2 10*3/uL — AB (ref 1.7–7.7)
Neutrophils Relative %: 72 % (ref 43–77)
Platelets: 322 10*3/uL (ref 150–400)
RBC: 4.59 MIL/uL (ref 4.22–5.81)
RDW: 12.8 % (ref 11.5–15.5)
WBC: 21.4 10*3/uL — ABNORMAL HIGH (ref 4.0–10.5)

## 2014-11-01 LAB — RAPID URINE DRUG SCREEN, HOSP PERFORMED
Amphetamines: NOT DETECTED
Barbiturates: NOT DETECTED
Benzodiazepines: POSITIVE — AB
Cocaine: NOT DETECTED
OPIATES: NOT DETECTED
Tetrahydrocannabinol: POSITIVE — AB

## 2014-11-01 LAB — ETHANOL

## 2014-11-01 LAB — I-STAT CHEM 8, ED
BUN: 9 mg/dL (ref 6–23)
CALCIUM ION: 1.16 mmol/L (ref 1.12–1.23)
CREATININE: 0.6 mg/dL (ref 0.50–1.35)
Chloride: 101 mmol/L (ref 96–112)
GLUCOSE: 155 mg/dL — AB (ref 70–99)
HCT: 47 % (ref 39.0–52.0)
HEMOGLOBIN: 16 g/dL (ref 13.0–17.0)
Potassium: 3.6 mmol/L (ref 3.5–5.1)
Sodium: 140 mmol/L (ref 135–145)
TCO2: 26 mmol/L (ref 0–100)

## 2014-11-01 LAB — PROTIME-INR
INR: 0.99 (ref 0.00–1.49)
PROTHROMBIN TIME: 13.1 s (ref 11.6–15.2)

## 2014-11-01 LAB — TYPE AND SCREEN
ABO/RH(D): O POS
ANTIBODY SCREEN: NEGATIVE

## 2014-11-01 MED ORDER — HYDROMORPHONE HCL 1 MG/ML IJ SOLN
0.5000 mg | Freq: Once | INTRAMUSCULAR | Status: AC
  Administered 2014-11-01: 0.5 mg via INTRAVENOUS
  Filled 2014-11-01: qty 1

## 2014-11-01 MED ORDER — SODIUM CHLORIDE 0.9 % IV BOLUS (SEPSIS)
1000.0000 mL | Freq: Once | INTRAVENOUS | Status: AC
  Administered 2014-11-01: 1000 mL via INTRAVENOUS

## 2014-11-01 MED ORDER — HYDROMORPHONE HCL 1 MG/ML IJ SOLN: 0.5000 mg | Freq: Once | INTRAMUSCULAR | Status: DC

## 2014-11-01 MED ORDER — IOHEXOL 300 MG/ML  SOLN
100.0000 mL | Freq: Once | INTRAMUSCULAR | Status: AC | PRN
  Administered 2014-11-01: 100 mL via INTRAVENOUS

## 2014-11-01 MED ORDER — HYDROMORPHONE HCL 1 MG/ML IJ SOLN: 1.0000 mg | Freq: Once | INTRAMUSCULAR | Status: DC

## 2014-11-01 MED ORDER — HYDROMORPHONE HCL 1 MG/ML IJ SOLN
0.5000 mg | Freq: Once | INTRAMUSCULAR | Status: AC
  Administered 2014-11-01: 0.5 mg via INTRAVENOUS

## 2014-11-01 MED ORDER — HYDROMORPHONE HCL 1 MG/ML IJ SOLN
INTRAMUSCULAR | Status: AC
  Filled 2014-11-01: qty 1
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
## 2014-11-01 NOTE — ED Notes (Signed)
Wound care performed by amanda stratton, emt.

## 2014-11-01 NOTE — ED Notes (Signed)
Dr Denton LankSteinl given a copy of lactic acid results 2.76

## 2014-11-01 NOTE — Progress Notes (Signed)
Orthopedic Tech Progress Note Patient Details:  Troy HavenJoseph Carter 03-18-1981 [id number]  Ortho Devices Type of Ortho Device: Ace wrap, Long arm splint Ortho Device/Splint Interventions: Application   Cammer, Mickie BailJennifer Carol 11/01/2014, 9:17 PM

## 2014-11-01 NOTE — ED Notes (Signed)
Verbal order from Dr. Denton LankSteinl OK to stick pt's foot for lab draw.
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
## 2014-11-01 NOTE — ED Notes (Signed)
CT called to inquire about when pt can go to CT scan.  

## 2014-11-01 NOTE — ED Notes (Signed)
Ortho tech paged  

## 2014-11-01 NOTE — ED Notes (Signed)
Family updated as to patient's status.
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
## 2014-11-01 NOTE — ED Notes (Signed)
CT called and informed pt's creatine has resulted and that he is ready for CT.

## 2014-11-01 NOTE — ED Notes (Signed)
MD notified of new onset of pain in chest and right hip

## 2014-11-02 ENCOUNTER — Inpatient Hospital Stay (HOSPITAL_COMMUNITY): Payer: No Typology Code available for payment source

## 2014-11-02 ENCOUNTER — Encounter (HOSPITAL_COMMUNITY): Payer: Self-pay | Admitting: Neurological Surgery

## 2014-11-02 DIAGNOSIS — S069X9A Unspecified intracranial injury with loss of consciousness of unspecified duration, initial encounter: Secondary | ICD-10-CM | POA: Diagnosis present

## 2014-11-02 DIAGNOSIS — S2691XA Contusion of heart, unspecified with or without hemopericardium, initial encounter: Secondary | ICD-10-CM

## 2014-11-02 DIAGNOSIS — S069XAA Unspecified intracranial injury with loss of consciousness status unknown, initial encounter: Secondary | ICD-10-CM | POA: Diagnosis present

## 2014-11-02 LAB — BASIC METABOLIC PANEL
Anion gap: 11 (ref 5–15)
BUN: <5 mg/dL — ABNORMAL LOW (ref 6–23)
CO2: 25 mmol/L (ref 19–32)
CREATININE: 0.56 mg/dL (ref 0.50–1.35)
Calcium: 8.9 mg/dL (ref 8.4–10.5)
Chloride: 106 mmol/L (ref 96–112)
GFR calc non Af Amer: >90 mL/min (ref >90)
Glucose, Bld: 104 mg/dL — ABNORMAL HIGH (ref 70–99)
Potassium: 3.4 mmol/L — ABNORMAL LOW (ref 3.5–5.1)
Sodium: 142 mmol/L (ref 135–145)

## 2014-11-02 LAB — CBC
HCT: 41.3 % (ref 39.0–52.0)
HEMOGLOBIN: 13.8 g/dL (ref 13.0–17.0)
MCH: 31.7 pg (ref 26.0–34.0)
MCHC: 33.4 g/dL (ref 30.0–36.0)
MCV: 94.7 fL (ref 78.0–100.0)
PLATELETS: 285 10*3/uL (ref 150–400)
RBC: 4.36 MIL/uL (ref 4.22–5.81)
RDW: 12.9 % (ref 11.5–15.5)
WBC: 23.9 10*3/uL — AB (ref 4.0–10.5)

## 2014-11-02 LAB — MRSA PCR SCREENING: MRSA by PCR: NEGATIVE

## 2014-11-02 MED ORDER — ACETAMINOPHEN 325 MG PO TABS: 650.0000 mg | ORAL_TABLET | ORAL | Status: DC | PRN

## 2014-11-02 MED ORDER — HYDROMORPHONE HCL 1 MG/ML IJ SOLN
0.5000 mg | INTRAMUSCULAR | Status: DC | PRN
  Administered 2014-11-02: 0.5 mg via INTRAVENOUS
  Administered 2014-11-02 – 2014-11-04 (×7): 1 mg via INTRAVENOUS
  Administered 2014-11-04: 2 mg via INTRAVENOUS
  Filled 2014-11-02 (×6): qty 1
  Filled 2014-11-02: qty 2
  Filled 2014-11-02 (×3): qty 1

## 2014-11-02 MED ORDER — CLINDAMYCIN HCL 300 MG PO CAPS
300.0000 mg | ORAL_CAPSULE | Freq: Three times a day (TID) | ORAL | Status: DC
Start: 2014-11-02 — End: 2014-11-03
  Administered 2014-11-02: 300 mg via ORAL
  Filled 2014-11-02 (×6): qty 1

## 2014-11-02 MED ORDER — DOCUSATE SODIUM 100 MG PO CAPS
100.0000 mg | ORAL_CAPSULE | Freq: Two times a day (BID) | ORAL | Status: DC
  Administered 2014-11-02 – 2014-11-10 (×9): 100 mg via ORAL
  Filled 2014-11-02 (×16): qty 1

## 2014-11-02 MED ORDER — IBUPROFEN 200 MG PO TABS
600.0000 mg | ORAL_TABLET | ORAL | Status: DC | PRN
  Administered 2014-11-02: 600 mg via ORAL
  Filled 2014-11-02: qty 3

## 2014-11-02 MED ORDER — VALPROIC ACID 250 MG PO CAPS
250.0000 mg | ORAL_CAPSULE | Freq: Every day | ORAL | Status: DC
  Administered 2014-11-02 – 2014-11-09 (×6): 250 mg via ORAL
  Filled 2014-11-02 (×14): qty 1

## 2014-11-02 MED ORDER — BACITRACIN ZINC 500 UNIT/GM EX OINT
TOPICAL_OINTMENT | Freq: Two times a day (BID) | CUTANEOUS | Status: DC
  Administered 2014-11-02: 1 via TOPICAL
  Administered 2014-11-02: 22:00:00 via TOPICAL
  Administered 2014-11-03: 1 via TOPICAL
  Administered 2014-11-03 – 2014-11-10 (×12): via TOPICAL
  Filled 2014-11-02: qty 28.35

## 2014-11-02 MED ORDER — CLONAZEPAM 1 MG PO TABS
1.0000 mg | ORAL_TABLET | Freq: Three times a day (TID) | ORAL | Status: DC | PRN
  Administered 2014-11-02 – 2014-11-09 (×12): 1 mg via ORAL
  Filled 2014-11-02 (×14): qty 1

## 2014-11-02 MED ORDER — HYDROMORPHONE HCL 1 MG/ML IJ SOLN
0.5000 mg | INTRAMUSCULAR | Status: DC | PRN
  Administered 2014-11-02: 0.5 mg via INTRAVENOUS

## 2014-11-02 MED ORDER — OXYCODONE HCL 5 MG PO TABS
5.0000 mg | ORAL_TABLET | ORAL | Status: DC | PRN
  Administered 2014-11-02 – 2014-11-04 (×5): 10 mg via ORAL
  Filled 2014-11-02 (×5): qty 2

## 2014-11-02 MED ORDER — KCL IN DEXTROSE-NACL 20-5-0.45 MEQ/L-%-% IV SOLN
INTRAVENOUS | Status: DC
  Administered 2014-11-02 – 2014-11-03 (×3): via INTRAVENOUS
  Administered 2014-11-03: 75 mL/h via INTRAVENOUS
  Filled 2014-11-02 (×8): qty 1000

## 2014-11-02 MED ORDER — VALPROIC ACID 250 MG PO CAPS
500.0000 mg | ORAL_CAPSULE | Freq: Every day | ORAL | Status: DC
  Administered 2014-11-02 – 2014-11-07 (×6): 500 mg via ORAL
  Filled 2014-11-02 (×8): qty 2

## 2014-11-02 MED ORDER — CETYLPYRIDINIUM CHLORIDE 0.05 % MT LIQD
7.0000 mL | Freq: Two times a day (BID) | OROMUCOSAL | Status: DC
  Administered 2014-11-02 – 2014-11-10 (×14): 7 mL via OROMUCOSAL

## 2014-11-02 MED ORDER — BACLOFEN 10 MG PO TABS
10.0000 mg | ORAL_TABLET | Freq: Four times a day (QID) | ORAL | Status: DC | PRN
  Administered 2014-11-04 – 2014-11-06 (×2): 10 mg via ORAL
  Filled 2014-11-02 (×3): qty 1

## 2014-11-02 MED ORDER — ONDANSETRON HCL 4 MG/2ML IJ SOLN: 4.0000 mg | Freq: Three times a day (TID) | INTRAMUSCULAR | Status: DC | PRN

## 2014-11-02 MED ORDER — ONDANSETRON HCL 4 MG PO TABS: 4.0000 mg | ORAL_TABLET | Freq: Four times a day (QID) | ORAL | Status: DC | PRN

## 2014-11-02 MED ORDER — ONDANSETRON HCL 4 MG/2ML IJ SOLN: 4.0000 mg | Freq: Four times a day (QID) | INTRAMUSCULAR | Status: DC | PRN

## 2014-11-02 MED ORDER — HYDROMORPHONE HCL 1 MG/ML IJ SOLN
0.5000 mg | INTRAMUSCULAR | Status: DC | PRN
  Administered 2014-11-02: 0.5 mg via INTRAVENOUS
  Filled 2014-11-02: qty 1

## 2014-11-02 MED ORDER — BISACODYL 10 MG RE SUPP: 10.0000 mg | Freq: Every day | RECTAL | Status: DC | PRN
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
## 2014-11-02 NOTE — Progress Notes (Signed)
Central Venous Catheter Insertion Procedure Note  Procedure: Insertion of Central Venous Catheter  Indications:  vascular access  Procedure Details  Informed consent was obtained for the procedure, including sedation.  Risks of lung perforation, hemorrhage, arrhythmia, and adverse drug reaction were discussed.   Maximum sterile technique was used including antiseptics, cap, gloves, gown, hand hygiene, mask and sheet.  Under sterile conditions the skin above the on the left neck and chest  was prepped with chloraprep and covered with a sterile drape. Local anesthesia was applied to the skin and subcutaneous tissues. Under u/s guidance I identified the Left IJ vein and an 18 gauge needle was advanced three times directly into the L IJ under u/s guidance but I could not aspirate any blood. Therefore I switched to the L subclavian vein. An 18-gauge needle was then inserted into the vein. A guide wire was then passed easily through the catheter. There were no arrhythmias. The catheter was then withdrawn. A 7.0 French triple-lumen was then inserted into the vessel over the guide wire. The catheter was sutured into place.  Findings: There were no changes to vital signs. Catheter was flushed with 20 cc NS. Patient did tolerate procedure well.  Recommendations: CXR ordered to verify placement.  Mary SellaEric M. [name], MD, FACS General, Bariatric, & Minimally Invasive Surgery [facility] [place]
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
## 2014-11-03 DIAGNOSIS — S069X2S Unspecified intracranial injury with loss of consciousness of 31 minutes to 59 minutes, sequela: Secondary | ICD-10-CM

## 2014-11-03 MED ORDER — TAMSULOSIN HCL 0.4 MG PO CAPS
0.4000 mg | ORAL_CAPSULE | Freq: Every day | ORAL | Status: DC
  Administered 2014-11-03 – 2014-11-10 (×6): 0.4 mg via ORAL
  Filled 2014-11-03 (×7): qty 1
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
## 2014-11-03 NOTE — Progress Notes (Signed)
UR completed.  Romyn Boswell, RN BSN [facility] CCM Trauma/Neuro ICU Case Manager [phone]  
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
## 2014-11-03 NOTE — Consult Note (Signed)
Orthopaedic Trauma Service Consult  Pt seen and evaluated Full consult dictated  Mearl LatinKeith W. Reily Ilic, PA-C [facility] [phone] (P) 11/03/2014 6:00 PM

## 2014-11-03 NOTE — Progress Notes (Signed)
Pt unable to void after multiple attempts, pt complains of being uncomfortable and having the urge to void, bladder scan shows 580mL in bladder. Pt states this is an on-going issues at home, when questioned by RN about how he voids at home, pt states "i just have to try really hard, I've been meaning to go to the doctor about it". Trauma on call Dr. [name] notified and order received to place foley.

## 2014-11-03 NOTE — Progress Notes (Signed)
PT Cancellation Note  Patient Details Name: [name] MRN: [id number] DOB: [birth year]   Cancelled Treatment:    Reason Eval/Treat Not Completed: Patient not medically ready. Continue to monitor pt's condition and await orthopedic intervention for RUE (pt had been made NPO today with ?plans for surgery to RUE??)   [name] 11/03/2014, 4:35 PM  Pager [phone]

## 2014-11-03 NOTE — Clinical Social Work Note (Signed)
CSW met with patient at bedside this afternoon. Patient was sobbing while talking to his lawyer on the phone. Patient ended call with his lawyer and was willing to speak with CSW. Patient stopped crying and seemed less distressed after ending phone call. Patient states that he is upset because he doesn't know what is going to happen to him.  CSW explained reason for visit (CIR has been recommended and SNF needs to be pursued as an alternative). Patient explains that he has been to [facility] in [place] in the past and would prefer this facility because he is comfortable with the people who work there. CSW explained SNF search/placement process and answered patient's questions. CSW will begin SNF search so that alternative disposition will be available when patient is ready for DC. CSW provided patient with ice water (with RN's permission) and notified RN of patient's request to be repositioned. CSW will continue to follow.    Liz Beach MSW, [place], [place], [phone]

## 2014-11-03 NOTE — Progress Notes (Signed)
Rehab Admissions Coordinator Note:  Patient was screened by Trish MageLogue, Gunner Iodice M for appropriateness for an Inpatient Acute Rehab Consult.  At this time, we are recommending Inpatient Rehab consult.  Trish MageLogue, Thea Holshouser M 11/03/2014, 8:42 AM  I can be reached at [phone].
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
## 2014-11-03 NOTE — Clinical Social Work Placement (Addendum)
Clinical Social Work Department CLINICAL SOCIAL WORK PLACEMENT NOTE 11/03/2014  Patient:  Vale HavenWILSON,Derius  Account Number:  [id number] Admit date:  11/01/2014  Clinical Social Worker:  Cherre BlancJOSEPH BRYANT CAMPBELL, [place]  Date/time:  11/03/2014 04:00 PM  Clinical Social Work is seeking post-discharge placement for this patient at the following level of care:   SKILLED NURSING   (*CSW will update this form in Epic as items are completed)   11/03/2014  Patient/family provided with [facility] Department of Clinical Social Work's list of facilities offering this level of care within the geographic area requested by the patient (or if unable, by the patient's family).  11/03/2014  Patient/family informed of their freedom to choose among providers that offer the needed level of care, that participate in Medicare, Medicaid or managed care program needed by the patient, have an available bed and are willing to accept the patient.  11/03/2014  Patient/family informed of [facility]' ownership interest in [facility], as well as of the fact that they are under no obligation to receive care at this facility.  PASARR submitted to EDS on EXISTING PASARR number received on   FL2 transmitted to all facilities in geographic area requested by pt/family on  11/03/2014 FL2 transmitted to all facilities within larger geographic area on   Patient informed that his/her managed care company has contracts with or will negotiate with  certain facilities, including the following:     Patient/family informed of bed offers received:  11/06/2014 [facility] Physician recommends and patient chooses bed at    [facility]   [facility] Patient and family notified of transfer on 11/10/2014 Name of family member notified:  Corrie DandyMARY (GRANDMOTHER) [phone]  The following physician  request were entered in Epic:   Additional Comments:   Roddie McBryant Campbell MSW, [place], [place], [phone]
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
## 2014-11-04 ENCOUNTER — Inpatient Hospital Stay (HOSPITAL_COMMUNITY): Payer: No Typology Code available for payment source

## 2014-11-04 ENCOUNTER — Other Ambulatory Visit: Payer: Self-pay | Admitting: *Deleted

## 2014-11-04 ENCOUNTER — Encounter (HOSPITAL_COMMUNITY): Payer: Self-pay | Admitting: *Deleted

## 2014-11-04 DIAGNOSIS — S3500XD Unspecified injury of abdominal aorta, subsequent encounter: Secondary | ICD-10-CM

## 2014-11-04 LAB — BASIC METABOLIC PANEL
Anion gap: 8 (ref 5–15)
CO2: 29 mmol/L (ref 19–32)
Calcium: 8.2 mg/dL — ABNORMAL LOW (ref 8.4–10.5)
Chloride: 103 mmol/L (ref 96–112)
Creatinine, Ser: 0.36 mg/dL — ABNORMAL LOW (ref 0.50–1.35)
GFR calc Af Amer: >90 mL/min (ref >90)
GLUCOSE: 113 mg/dL — AB (ref 70–99)
Potassium: 3.7 mmol/L (ref 3.5–5.1)
Sodium: 140 mmol/L (ref 135–145)

## 2014-11-04 LAB — CBC
HCT: 39.3 % (ref 39.0–52.0)
Hemoglobin: 13.1 g/dL (ref 13.0–17.0)
MCH: 32.3 pg (ref 26.0–34.0)
MCHC: 33.3 g/dL (ref 30.0–36.0)
MCV: 96.8 fL (ref 78.0–100.0)
Platelets: 223 10*3/uL (ref 150–400)
RBC: 4.06 MIL/uL — AB (ref 4.22–5.81)
RDW: 13 % (ref 11.5–15.5)
WBC: 13.6 10*3/uL — ABNORMAL HIGH (ref 4.0–10.5)

## 2014-11-04 MED ORDER — LIDOCAINE HCL 1 % IJ SOLN
INTRAMUSCULAR | Status: AC
Start: 2014-11-04 — End: 2014-11-05
  Filled 2014-11-04: qty 20

## 2014-11-04 MED ORDER — IOHEXOL 350 MG/ML SOLN
100.0000 mL | Freq: Once | INTRAVENOUS | Status: AC | PRN
  Administered 2014-11-04: 100 mL via INTRAVENOUS

## 2014-11-04 MED ORDER — OXYCODONE HCL 5 MG PO TABS
5.0000 mg | ORAL_TABLET | ORAL | Status: DC | PRN
  Administered 2014-11-04 – 2014-11-06 (×7): 15 mg via ORAL
  Administered 2014-11-06: 10 mg via ORAL
  Administered 2014-11-06 – 2014-11-07 (×2): 15 mg via ORAL
  Administered 2014-11-07: 5 mg via ORAL
  Administered 2014-11-07: 15 mg via ORAL
  Administered 2014-11-07 – 2014-11-08 (×3): 10 mg via ORAL
  Administered 2014-11-08: 5 mg via ORAL
  Filled 2014-11-04 (×4): qty 3
  Filled 2014-11-04: qty 2
  Filled 2014-11-04 (×3): qty 3
  Filled 2014-11-04: qty 2
  Filled 2014-11-04: qty 1
  Filled 2014-11-04: qty 3
  Filled 2014-11-04: qty 2
  Filled 2014-11-04 (×4): qty 3

## 2014-11-04 MED ORDER — HYDROMORPHONE HCL 1 MG/ML IJ SOLN
0.5000 mg | INTRAMUSCULAR | Status: DC | PRN
  Administered 2014-11-04 – 2014-11-10 (×3): 0.5 mg via INTRAVENOUS
  Filled 2014-11-04 (×3): qty 1

## 2014-11-04 MED ORDER — CLINDAMYCIN PHOSPHATE 600 MG/50ML IV SOLN
600.0000 mg | Freq: Once | INTRAVENOUS | Status: AC
  Administered 2014-11-05: 600 mg via INTRAVENOUS
  Filled 2014-11-04 (×2): qty 50

## 2014-11-04 MED ORDER — METOPROLOL TARTRATE 25 MG PO TABS
25.0000 mg | ORAL_TABLET | Freq: Two times a day (BID) | ORAL | Status: DC
Start: 2014-11-04 — End: 2014-11-10
  Administered 2014-11-04 – 2014-11-10 (×10): 25 mg via ORAL
  Filled 2014-11-04 (×10): qty 1

## 2014-11-04 NOTE — Progress Notes (Signed)
Notified Dr Lin GivensJeffries that pt has had a heart rate in the 140s whenever undergoing an activity such as BP or taking meds. Pt. Placed on 2L of oxygen with saturation around 95%.

## 2014-11-04 NOTE — Clinical Social Work Note (Signed)
Clinical Social Worker continuing to follow patient and family for support and discharge planning needs.  Patient was down for testing upon CSW arrival with many friends at bedside.  Patient friends state that patient has been living with them for about 4 months and is welcome to return home with them following a short rehab stay.  [facility] [place] with inpatient rehab admissions coordinator, however no bed available at this facility.  Patient friends state that if [place] could remain in [place] it would benefit all of them.  CSW to follow up with patient following surgery tomorrow to provide available bed offers.    Patient friends also inquiring about patient ability to receive a new wheelchair for his arrival home.  Patient friends are home 24/7 and are prepared to provide necessary support to patient as needed.  CSW to inquire with CM regarding patient ability to receive a new wheelchair through his insurance.  CSW remains available for support and to facilitate patient discharge needs once medically stable.  Macario GoldsJesse Demetrica Zipp, [place]CSW [phone]

## 2014-11-04 NOTE — Progress Notes (Signed)
Patient ID: [name], male   DOB: 1980-07-26, 34 y.o.   MRN: [id number]  Left CVC withdrawn ~1.5cm.    Freeman CaldronMichael J. Christion Leonhard, PA-C Pager: [phone] General Trauma PA Pager: [phone]

## 2014-11-04 NOTE — Progress Notes (Signed)
PT Cancellation Note  Patient Details Name: Vale HavenJoseph Carboni MRN: [id number] DOB: [birth year]   Cancelled Treatment:    Reason Eval/Treat Not Completed: Medical issues which prohibited therapy Patient with resting heart rate in 120s, noted to be mildly hypoxic earlier by MD. Ortho report indicates Rt humerus ORIF planned for tomorrow. Will hold therapy at this time. Please re-order post-op for continued therapy, indicating WB status for Rt humerus.  Berton MountBarbour, Kostantinos Tallman S 11/04/2014, 3:32 PM Sunday SpillersLogan Secor LigniteBarbour, [place] CarolinaPT [phone]

## 2014-11-04 NOTE — Progress Notes (Signed)
Troy BodeAdvised Cary RN that the MD needed to pull the CVC back per the recommendation coming from the radiologist. Consuello Masseimmons, [place] M

## 2014-11-04 NOTE — Procedures (Signed)
Interventional Radiology Procedure Note  Procedure: Placement of a right IJ approach single lumen Central catheter.  Tip is positioned at the superior cavoatrial junction and catheter is ready for immediate use.  Complications: No immediate Recommendations:  - Ok to shower tomorrow  - Routine line care   Signed,  Yvone NeuJaime S. Loreta AveWagner, DO
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
## 2014-11-04 NOTE — Progress Notes (Signed)
Rehab admissions - Evaluated for possible admission.  I met with patient.  He prefers to do his rehab at [facility] in [place].  He is familiar with that facility and has been there previously.  I will remain in the background in case needed.  Call me for questions.  #[phone]
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
## 2014-11-05 ENCOUNTER — Telehealth: Payer: Self-pay | Admitting: Vascular Surgery

## 2014-11-05 LAB — VITAMIN D 25 HYDROXY (VIT D DEFICIENCY, FRACTURES): Vit D, 25-Hydroxy: 20 ng/mL — ABNORMAL LOW (ref 30.0–100.0)

## 2014-11-05 MED ORDER — TRAMADOL HCL 50 MG PO TABS
100.0000 mg | ORAL_TABLET | Freq: Four times a day (QID) | ORAL | Status: DC
  Administered 2014-11-05 – 2014-11-09 (×11): 100 mg via ORAL
  Filled 2014-11-05 (×15): qty 2

## 2014-11-05 MED ORDER — SODIUM CHLORIDE 0.9 % IJ SOLN
10.0000 mL | INTRAMUSCULAR | Status: DC | PRN
  Administered 2014-11-05 – 2014-11-06 (×2): 10 mL
  Administered 2014-11-06: 20 mL
  Administered 2014-11-07 – 2014-11-10 (×4): 10 mL
  Filled 2014-11-05 (×6): qty 40

## 2014-11-05 MED ORDER — SODIUM CHLORIDE 0.9 % IJ SOLN
10.0000 mL | Freq: Two times a day (BID) | INTRAMUSCULAR | Status: DC
  Administered 2014-11-07 – 2014-11-09 (×2): 10 mL

## 2014-11-05 NOTE — Telephone Encounter (Signed)
-----   Message from Sharee PimpleMarilyn K McChesney, RN sent at 11/04/2014 10:42 AM EDT ----- Regarding: Schedule   ----- Message -----    From: Chuck Hinthristopher S Dickson, MD    Sent: 11/03/2014   4:22 PM      To: Vvs Charge Pool Subject: charge and f/u                                 Level 1 f/u visit Can be taken off of the list. Needs f/u duplex or abdominal aorta in 3 months.(aortic contusion by CT scan- soft finding) Thanks CD
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
## 2014-11-05 NOTE — Telephone Encounter (Signed)
LM for pt re appt, mailed letter, dpm °
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
## 2014-11-06 ENCOUNTER — Inpatient Hospital Stay (HOSPITAL_COMMUNITY): Payer: No Typology Code available for payment source

## 2014-11-06 ENCOUNTER — Inpatient Hospital Stay (HOSPITAL_COMMUNITY): Payer: No Typology Code available for payment source | Admitting: Anesthesiology

## 2014-11-06 ENCOUNTER — Encounter (HOSPITAL_COMMUNITY): Admission: EM | Disposition: A | Discharge status: Skilled Nursing Facility | Payer: Self-pay | Source: Home / Self Care

## 2014-11-06 ENCOUNTER — Encounter (HOSPITAL_COMMUNITY): Payer: Self-pay | Admitting: Certified Registered Nurse Anesthetist

## 2014-11-06 DIAGNOSIS — S42301A Unspecified fracture of shaft of humerus, right arm, initial encounter for closed fracture: Secondary | ICD-10-CM | POA: Diagnosis present

## 2014-11-06 DIAGNOSIS — S37812A Contusion of adrenal gland, initial encounter: Secondary | ICD-10-CM | POA: Diagnosis present

## 2014-11-06 DIAGNOSIS — G40909 Epilepsy, unspecified, not intractable, without status epilepticus: Secondary | ICD-10-CM | POA: Insufficient documentation

## 2014-11-06 DIAGNOSIS — S2500XA Unspecified injury of thoracic aorta, initial encounter: Secondary | ICD-10-CM | POA: Diagnosis present

## 2014-11-06 DIAGNOSIS — S32009A Unspecified fracture of unspecified lumbar vertebra, initial encounter for closed fracture: Secondary | ICD-10-CM | POA: Diagnosis present

## 2014-11-06 DIAGNOSIS — Q688 Other specified congenital musculoskeletal deformities: Secondary | ICD-10-CM | POA: Insufficient documentation

## 2014-11-06 DIAGNOSIS — K219 Gastro-esophageal reflux disease without esophagitis: Secondary | ICD-10-CM | POA: Insufficient documentation

## 2014-11-06 DIAGNOSIS — S270XXA Traumatic pneumothorax, initial encounter: Secondary | ICD-10-CM | POA: Diagnosis present

## 2014-11-06 DIAGNOSIS — S0081XA Abrasion of other part of head, initial encounter: Secondary | ICD-10-CM | POA: Diagnosis present

## 2014-11-06 HISTORY — PX: ORIF HUMERUS FRACTURE: SHX2126

## 2014-11-06 LAB — CBC
HEMATOCRIT: 30.9 % — AB (ref 39.0–52.0)
Hemoglobin: 10.6 g/dL — ABNORMAL LOW (ref 13.0–17.0)
MCH: 31.8 pg (ref 26.0–34.0)
MCHC: 34.3 g/dL (ref 30.0–36.0)
MCV: 92.8 fL (ref 78.0–100.0)
Platelets: 268 10*3/uL (ref 150–400)
RBC: 3.33 MIL/uL — ABNORMAL LOW (ref 4.22–5.81)
RDW: 12.5 % (ref 11.5–15.5)
WBC: 12.4 10*3/uL — ABNORMAL HIGH (ref 4.0–10.5)

## 2014-11-06 LAB — CREATININE, SERUM
Creatinine, Ser: 0.46 mg/dL — ABNORMAL LOW (ref 0.50–1.35)
GFR calc Af Amer: >90 mL/min (ref >90)
GFR calc non Af Amer: >90 mL/min (ref >90)

## 2014-11-06 SURGERY — OPEN REDUCTION INTERNAL FIXATION (ORIF) HUMERAL SHAFT FRACTURE
Anesthesia: General | Site: Arm Upper | Laterality: Right

## 2014-11-06 MED ORDER — FENTANYL CITRATE (PF) 250 MCG/5ML IJ SOLN
INTRAMUSCULAR | Status: AC
  Filled 2014-11-06: qty 5

## 2014-11-06 MED ORDER — ENOXAPARIN SODIUM 40 MG/0.4ML ~~LOC~~ SOLN
40.0000 mg | SUBCUTANEOUS | Status: DC
  Administered 2014-11-07 – 2014-11-10 (×4): 40 mg via SUBCUTANEOUS
  Filled 2014-11-06 (×5): qty 0.4

## 2014-11-06 MED ORDER — PHENOL 1.4 % MT LIQD: 1.0000 | OROMUCOSAL | Status: DC | PRN

## 2014-11-06 MED ORDER — METOCLOPRAMIDE HCL 5 MG/ML IJ SOLN: 5.0000 mg | Freq: Three times a day (TID) | INTRAMUSCULAR | Status: DC | PRN

## 2014-11-06 MED ORDER — 0.9 % SODIUM CHLORIDE (POUR BTL) OPTIME
TOPICAL | Status: DC | PRN
  Administered 2014-11-06: 1000 mL

## 2014-11-06 MED ORDER — OXYCODONE HCL 5 MG PO TABS
ORAL_TABLET | ORAL | Status: AC
  Filled 2014-11-06: qty 2

## 2014-11-06 MED ORDER — PROMETHAZINE HCL 25 MG/ML IJ SOLN: 6.2500 mg | INTRAMUSCULAR | Status: DC | PRN

## 2014-11-06 MED ORDER — ROCURONIUM BROMIDE 50 MG/5ML IV SOLN
INTRAVENOUS | Status: AC
  Filled 2014-11-06: qty 1

## 2014-11-06 MED ORDER — GLYCOPYRROLATE 0.2 MG/ML IJ SOLN
INTRAMUSCULAR | Status: DC | PRN
Start: 2014-11-06 — End: 2014-11-06
  Administered 2014-11-06: 0.4 mg via INTRAVENOUS

## 2014-11-06 MED ORDER — LACTATED RINGERS IV SOLN
INTRAVENOUS | Status: DC
  Administered 2014-11-06 – 2014-11-08 (×4): via INTRAVENOUS

## 2014-11-06 MED ORDER — ROCURONIUM BROMIDE 100 MG/10ML IV SOLN
INTRAVENOUS | Status: DC | PRN
  Administered 2014-11-06: 10 mg via INTRAVENOUS
  Administered 2014-11-06: 20 mg via INTRAVENOUS
  Administered 2014-11-06: 40 mg via INTRAVENOUS

## 2014-11-06 MED ORDER — ONDANSETRON HCL 4 MG/2ML IJ SOLN
INTRAMUSCULAR | Status: AC
  Filled 2014-11-06: qty 2

## 2014-11-06 MED ORDER — ONDANSETRON HCL 4 MG/2ML IJ SOLN: 4.0000 mg | Freq: Four times a day (QID) | INTRAMUSCULAR | Status: DC | PRN

## 2014-11-06 MED ORDER — METOCLOPRAMIDE HCL 5 MG PO TABS: 5.0000 mg | ORAL_TABLET | Freq: Three times a day (TID) | ORAL | Status: DC | PRN

## 2014-11-06 MED ORDER — LACTATED RINGERS IV SOLN
INTRAVENOUS | Status: DC | PRN
  Administered 2014-11-06 (×3): via INTRAVENOUS

## 2014-11-06 MED ORDER — FENTANYL CITRATE (PF) 100 MCG/2ML IJ SOLN
INTRAMUSCULAR | Status: DC | PRN
  Administered 2014-11-06: 50 ug via INTRAVENOUS
  Administered 2014-11-06: 100 ug via INTRAVENOUS

## 2014-11-06 MED ORDER — ACETAMINOPHEN 325 MG PO TABS
650.0000 mg | ORAL_TABLET | Freq: Four times a day (QID) | ORAL | Status: DC | PRN
Start: 2014-11-06 — End: 2014-11-10

## 2014-11-06 MED ORDER — GLYCOPYRROLATE 0.2 MG/ML IJ SOLN
INTRAMUSCULAR | Status: AC
  Filled 2014-11-06: qty 2

## 2014-11-06 MED ORDER — PHENYLEPHRINE HCL 10 MG/ML IJ SOLN
INTRAMUSCULAR | Status: DC | PRN
  Administered 2014-11-06 (×2): 80 ug via INTRAVENOUS
  Administered 2014-11-06: 100 ug via INTRAVENOUS
  Administered 2014-11-06: 40 ug via INTRAVENOUS

## 2014-11-06 MED ORDER — FENTANYL CITRATE (PF) 100 MCG/2ML IJ SOLN
INTRAMUSCULAR | Status: AC
  Filled 2014-11-06: qty 2

## 2014-11-06 MED ORDER — FENTANYL CITRATE (PF) 100 MCG/2ML IJ SOLN
25.0000 ug | INTRAMUSCULAR | Status: DC | PRN
  Administered 2014-11-06 (×3): 50 ug via INTRAVENOUS

## 2014-11-06 MED ORDER — MENTHOL 3 MG MT LOZG: 1.0000 | LOZENGE | OROMUCOSAL | Status: DC | PRN

## 2014-11-06 MED ORDER — NEOSTIGMINE METHYLSULFATE 10 MG/10ML IV SOLN
INTRAVENOUS | Status: AC
  Filled 2014-11-06: qty 1

## 2014-11-06 MED ORDER — LIDOCAINE HCL (CARDIAC) 20 MG/ML IV SOLN
INTRAVENOUS | Status: AC
  Filled 2014-11-06: qty 5

## 2014-11-06 MED ORDER — NEOSTIGMINE METHYLSULFATE 10 MG/10ML IV SOLN
INTRAVENOUS | Status: DC | PRN
  Administered 2014-11-06: 3 mg via INTRAVENOUS

## 2014-11-06 MED ORDER — PROPOFOL 10 MG/ML IV BOLUS
INTRAVENOUS | Status: DC | PRN
  Administered 2014-11-06: 150 mg via INTRAVENOUS

## 2014-11-06 MED ORDER — PROPOFOL 10 MG/ML IV BOLUS
INTRAVENOUS | Status: AC
  Filled 2014-11-06: qty 20

## 2014-11-06 MED ORDER — MIDAZOLAM HCL 5 MG/5ML IJ SOLN
INTRAMUSCULAR | Status: DC | PRN
  Administered 2014-11-06: 2 mg via INTRAVENOUS

## 2014-11-06 MED ORDER — ACETAMINOPHEN 650 MG RE SUPP: 650.0000 mg | Freq: Four times a day (QID) | RECTAL | Status: DC | PRN

## 2014-11-06 MED ORDER — MIDAZOLAM HCL 2 MG/2ML IJ SOLN
INTRAMUSCULAR | Status: AC
  Filled 2014-11-06: qty 2

## 2014-11-06 MED ORDER — CLINDAMYCIN PHOSPHATE 600 MG/50ML IV SOLN
600.0000 mg | Freq: Four times a day (QID) | INTRAVENOUS | Status: AC
  Administered 2014-11-06 – 2014-11-07 (×3): 600 mg via INTRAVENOUS
  Filled 2014-11-06 (×3): qty 50

## 2014-11-06 MED ORDER — ONDANSETRON HCL 4 MG/2ML IJ SOLN
INTRAMUSCULAR | Status: DC | PRN
  Administered 2014-11-06: 4 mg via INTRAVENOUS

## 2014-11-06 MED ORDER — ONDANSETRON HCL 4 MG PO TABS: 4.0000 mg | ORAL_TABLET | Freq: Four times a day (QID) | ORAL | Status: DC | PRN

## 2014-11-06 SURGICAL SUPPLY — 75 items
BANDAGE ELASTIC 4 VELCRO ST LF (GAUZE/BANDAGES/DRESSINGS) ×2 IMPLANT
BENZOIN TINCTURE PRP APPL 2/3 (GAUZE/BANDAGES/DRESSINGS) IMPLANT
BIT DRILL 1.5MM 95MM JCC (BIT) ×1 IMPLANT
BIT DRILL 2.5X110 QC LCP DISP (BIT) ×2 IMPLANT
BIT DRILL 2.8 (BIT) ×1
BIT DRILL CANN QC 2.8X165 (BIT) ×1 IMPLANT
BNDG ESMARK 4X9 LF (GAUZE/BANDAGES/DRESSINGS) IMPLANT
BNDG GAUZE ELAST 4 BULKY (GAUZE/BANDAGES/DRESSINGS) ×2 IMPLANT
BRUSH SCRUB DISP (MISCELLANEOUS) ×4 IMPLANT
CORDS BIPOLAR (ELECTRODE) IMPLANT
COVER SURGICAL LIGHT HANDLE (MISCELLANEOUS) ×4 IMPLANT
DRAPE C-ARM 42X72 X-RAY (DRAPES) ×2 IMPLANT
DRAPE C-ARMOR (DRAPES) ×2 IMPLANT
DRAPE IMP U-DRAPE 54X76 (DRAPES) ×2 IMPLANT
DRAPE ORTHO SPLIT 77X108 STRL (DRAPES)
DRAPE SURG 17X11 SM STRL (DRAPES) IMPLANT
DRAPE SURG ORHT 6 SPLT 77X108 (DRAPES) IMPLANT
DRAPE U-SHAPE 47X51 STRL (DRAPES) ×2 IMPLANT
DRILL BIT 2.8MM (BIT) ×1
DRILL BIT QC 1.5MM (BIT) ×1
DRSG ADAPTIC 3X8 NADH LF (GAUZE/BANDAGES/DRESSINGS) IMPLANT
DRSG MEPITEL 4X7.2 (GAUZE/BANDAGES/DRESSINGS) ×4 IMPLANT
DRSG PAD ABDOMINAL 8X10 ST (GAUZE/BANDAGES/DRESSINGS) ×4 IMPLANT
ELECT REM PT RETURN 9FT ADLT (ELECTROSURGICAL) ×2
ELECTRODE REM PT RTRN 9FT ADLT (ELECTROSURGICAL) ×1 IMPLANT
EVACUATOR 1/8 PVC DRAIN (DRAIN) IMPLANT
GAUZE SPONGE 4X4 12PLY STRL (GAUZE/BANDAGES/DRESSINGS) ×2 IMPLANT
GLOVE BIO SURGEON STRL SZ7.5 (GLOVE) ×2 IMPLANT
GLOVE BIO SURGEON STRL SZ8 (GLOVE) ×2 IMPLANT
GLOVE BIOGEL PI IND STRL 7.5 (GLOVE) ×1 IMPLANT
GLOVE BIOGEL PI IND STRL 8 (GLOVE) ×1 IMPLANT
GLOVE BIOGEL PI INDICATOR 7.5 (GLOVE) ×1
GLOVE BIOGEL PI INDICATOR 8 (GLOVE) ×1
GOWN STRL REUS W/ TWL LRG LVL3 (GOWN DISPOSABLE) ×2 IMPLANT
GOWN STRL REUS W/ TWL XL LVL3 (GOWN DISPOSABLE) ×1 IMPLANT
GOWN STRL REUS W/TWL LRG LVL3 (GOWN DISPOSABLE) ×2
GOWN STRL REUS W/TWL XL LVL3 (GOWN DISPOSABLE) ×1
KIT BASIN OR (CUSTOM PROCEDURE TRAY) ×2 IMPLANT
KIT ROOM TURNOVER OR (KITS) ×2 IMPLANT
MANIFOLD NEPTUNE II (INSTRUMENTS) ×2 IMPLANT
NEEDLE HYPO 25X1 1.5 SAFETY (NEEDLE) ×2 IMPLANT
NS IRRIG 1000ML POUR BTL (IV SOLUTION) ×2 IMPLANT
PACK ORTHO EXTREMITY (CUSTOM PROCEDURE TRAY) ×2 IMPLANT
PACK UNIVERSAL I (CUSTOM PROCEDURE TRAY) ×2 IMPLANT
PAD ARMBOARD 7.5X6 YLW CONV (MISCELLANEOUS) ×4 IMPLANT
PLATE LC DCP 2.0 6H (Plate) ×2 IMPLANT
PLATE LCP 3.5 18 HOLE (Plate) ×2 IMPLANT
SCREW CORTEX 2.0 28MM (Screw) ×2 IMPLANT
SCREW CORTEX 3.5 20MM (Screw) ×1 IMPLANT
SCREW CORTEX 3.5 24MM (Screw) ×4 IMPLANT
SCREW CORTEX ST 2.0X10 (Screw) ×6 IMPLANT
SCREW CORTEX ST 2.0X16 (Screw) ×2 IMPLANT
SCREW LOCK CORT ST 3.5X20 (Screw) ×1 IMPLANT
SCREW LOCK CORT ST 3.5X24 (Screw) ×4 IMPLANT
SCREW LOCK T15 FT 22X3.5XST (Screw) ×2 IMPLANT
SCREW LOCK T15 FT 24X3.5X2.9X (Screw) ×4 IMPLANT
SCREW LOCKING 3.5X22 (Screw) ×2 IMPLANT
SCREW LOCKING 3.5X24 (Screw) ×4 IMPLANT
SPONGE LAP 18X18 X RAY DECT (DISPOSABLE) ×2 IMPLANT
STAPLER VISISTAT 35W (STAPLE) ×2 IMPLANT
SUCTION FRAZIER TIP 10 FR DISP (SUCTIONS) ×2 IMPLANT
SUT ETHILON 3 0 PS 1 (SUTURE) ×6 IMPLANT
SUT PDS AB 2-0 CT1 27 (SUTURE) IMPLANT
SUT VIC AB 0 CT1 27 (SUTURE) ×2
SUT VIC AB 0 CT1 27XBRD ANBCTR (SUTURE) ×2 IMPLANT
SUT VIC AB 2-0 CT1 27 (SUTURE) ×2
SUT VIC AB 2-0 CT1 TAPERPNT 27 (SUTURE) ×2 IMPLANT
SYR 5ML LL (SYRINGE) IMPLANT
SYR CONTROL 10ML LL (SYRINGE) ×2 IMPLANT
TOWEL OR 17X24 6PK STRL BLUE (TOWEL DISPOSABLE) ×4 IMPLANT
TOWEL OR 17X26 10 PK STRL BLUE (TOWEL DISPOSABLE) ×4 IMPLANT
TRAY FOLEY CATH 16FRSI W/METER (SET/KITS/TRAYS/PACK) IMPLANT
TUBE CONNECTING 12X1/4 (SUCTIONS) ×2 IMPLANT
WATER STERILE IRR 1000ML POUR (IV SOLUTION) IMPLANT
YANKAUER SUCT BULB TIP NO VENT (SUCTIONS) IMPLANT
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
## 2014-11-06 NOTE — Anesthesia Preprocedure Evaluation (Addendum)
Anesthesia Evaluation  Patient identified by MRN, date of birth, ID band Patient awake    Airway Mallampati: II  TM Distance: <3 FB Neck ROM: Limited    Dental   Pulmonary pneumonia -, Current Smoker,  breath sounds clear to auscultation        Cardiovascular + Peripheral Vascular Disease Rhythm:Regular Rate:Normal     Neuro/Psych    GI/Hepatic Neg liver ROS, GERD-  ,  Endo/Other    Renal/GU negative Renal ROS     Musculoskeletal   Abdominal   Peds  Hematology   Anesthesia Other Findings   Reproductive/Obstetrics                            Anesthesia Physical Anesthesia Plan  ASA: III  Anesthesia Plan: General   Post-op Pain Management:    Induction: Intravenous  Airway Management Planned: Oral ETT  Additional Equipment:   Intra-op Plan:   Post-operative Plan: Possible Post-op intubation/ventilation  Informed Consent: I have reviewed the patients History and Physical, chart, labs and discussed the procedure including the risks, benefits and alternatives for the proposed anesthesia with the patient or authorized representative who has indicated his/her understanding and acceptance.   Dental advisory given  Plan Discussed with: CRNA, Anesthesiologist and Surgeon  Anesthesia Plan Comments:        Anesthesia Quick Evaluation

## 2014-11-06 NOTE — Progress Notes (Signed)
Physical Therapy Note  Patient to OR today. Please re-order physical therapy as indicated when patient is medically ready.  [address][name] [place], [place] [phone]   11/06/2014 - 10:25 AM

## 2014-11-06 NOTE — Progress Notes (Signed)
Medicare IM (Important Message) delivered to patient today by me in anticipation of discharge.   Dyani Babel, RN BSN [facility] CCM  Case Manager, Trauma Service/Unit 3M [phone]  

## 2014-11-06 NOTE — Brief Op Note (Signed)
11/01/2014 - 11/06/2014  12:45 PM  PATIENT:  [name]  34 y.o. male  PRE-OPERATIVE DIAGNOSIS:  RIGHT SEGMENTAL HUMERAL FRACTURE, PROXIMAL AND SHAFT   POST-OPERATIVE DIAGNOSIS: RIGHT SEGMENTAL HUMERAL FRACTURE, PROXIMAL AND SHAFT   PROCEDURE:  Procedure(s): OPEN REDUCTION INTERNAL FIXATION (ORIF) RIGHT SEGMENTAL HUMERAL FRACTURE, PROXIMAL AND SHAFT   SURGEON:  Surgeon(s) and Role:    * Myrene GalasMichael Laquetta Racey, MD - Primary  PHYSICIAN ASSISTANT: Montez MoritaKeith Paul, PA-C  ANESTHESIA:   general  I/O:  Total I/O In: 1000 [I.V.:1000] Out: 475 [Urine:425; Blood:50]  SPECIMEN:  No Specimen  TOURNIQUET:  * No tourniquets in log *  DICTATION: .Other Dictation: Dictation Number [id number]

## 2014-11-06 NOTE — Anesthesia Procedure Notes (Signed)
Procedure Name: Intubation Performed by: Kizzie FantasiaARVER, Kafi Dotter J Pre-anesthesia Checklist: Patient identified, Patient being monitored, Timeout performed, Emergency Drugs available and Suction available Patient Re-evaluated:Patient Re-evaluated prior to inductionOxygen Delivery Method: Circle system utilized Preoxygenation: Pre-oxygenation with 100% oxygen Intubation Type: IV induction Ventilation: Mask ventilation without difficulty Grade View: Grade I Tube type: Oral Tube size: 7.0 mm Number of attempts: 1 Placement Confirmation: ETT inserted through vocal cords under direct vision,  positive ETCO2,  CO2 detector and breath sounds checked- equal and bilateral Secured at: 22 cm Tube secured with: Tape Dental Injury: Teeth and Oropharynx as per pre-operative assessment

## 2014-11-06 NOTE — Progress Notes (Signed)
UR completed.  Nairobi Gustafson, RN BSN M[facility] CCM Trauma/Neuro ICU Case Manager [phone]  

## 2014-11-06 NOTE — Anesthesia Postprocedure Evaluation (Signed)
  Anesthesia Post-op Note  Patient: [name]  Procedure(s) Performed: Procedure(s): OPEN REDUCTION INTERNAL FIXATION (ORIF) RIGHT HUMERAL SHAFT FRACTURE (Right)  Patient Location: PACU  Anesthesia Type:General  Level of Consciousness: awake  Airway and Oxygen Therapy: Patient Spontanous Breathing  Post-op Pain: mild  Post-op Assessment: Post-op Vital signs reviewed  Post-op Vital Signs: Reviewed  Last Vitals:  Filed Vitals:   11/06/14 0527  BP: 111/71  Pulse: 87  Temp: 36.6 C  Resp: 17    Complications: No apparent anesthesia complications

## 2014-11-06 NOTE — Clinical Social Work Note (Signed)
Clinical Social Worker continuing to follow patient and family for support and discharge planning needs.  CSW spoke with patient at bedside regarding available bed offers and he has chosen [facility] as a back up to inpatient rehab.  Patient verbalized his desire to remain here for inpatient rehab but understands that if no bed is available he will need SNF placement.  CSW confirmed with [facility] that patient can be admitted over the weekend.  Patient requests contact be made with his "grandmother," however we do not have contact information listed and patient too groggy to provide following surgery.  CSW remains available for support and to facilitate patient discharge needs once medically stable.  [name], [place] [phone]

## 2014-11-06 NOTE — Transfer of Care (Signed)
Immediate Anesthesia Transfer of Care Note  Patient: [name]  Procedure(s) Performed: Procedure(s): OPEN REDUCTION INTERNAL FIXATION (ORIF) RIGHT HUMERAL SHAFT FRACTURE (Right)  Patient Location: PACU  Anesthesia Type:General  Level of Consciousness: awake and alert   Airway & Oxygen Therapy: Patient Spontanous Breathing and Patient connected to nasal cannula oxygen  Post-op Assessment: Report given to RN and Post -op Vital signs reviewed and stable  Post vital signs: Reviewed and stable  Last Vitals:  Filed Vitals:   11/06/14 0527  BP: 111/71  Pulse: 87  Temp: 36.6 C  Resp: 17    Complications: No apparent anesthesia complications
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
## 2014-11-07 NOTE — Plan of Care (Signed)
Problem: Consults Goal: General Surgical Patient Education (See Patient Education module for education specifics) Outcome: Completed/Met Date Met:  11/07/14 ORIF R humeral shaft
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
## 2014-11-07 NOTE — Progress Notes (Signed)
PT Cancellation Note  Patient Details Name: [name] MRN: [id number] DOB: 1981/07/01   Cancelled Treatment:    Reason Eval/Treat Not Completed: Fatigue/lethargy limiting ability to participate. Pt in bedside reliner. He is very lethargic and unable to arouse to participate in PT eval. PT to re-attempt this PM.   Ilda FoilGarrow, Milanni Ayub Rene 11/07/2014, 11:37 AM
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
## 2014-11-08 MED ORDER — VALPROIC ACID 250 MG PO CAPS
500.0000 mg | ORAL_CAPSULE | Freq: Every day | ORAL | Status: DC
  Administered 2014-11-08 – 2014-11-09 (×2): 500 mg via ORAL
  Filled 2014-11-08 (×3): qty 2
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
## 2014-11-09 ENCOUNTER — Encounter (HOSPITAL_COMMUNITY): Payer: Self-pay | Admitting: Orthopedic Surgery

## 2014-11-09 DIAGNOSIS — E559 Vitamin D deficiency, unspecified: Secondary | ICD-10-CM | POA: Diagnosis present

## 2014-11-09 MED ORDER — VITAMIN C 500 MG PO TABS
500.0000 mg | ORAL_TABLET | Freq: Two times a day (BID) | ORAL | Status: DC
  Administered 2014-11-10: 500 mg via ORAL
  Filled 2014-11-09 (×2): qty 1

## 2014-11-09 MED ORDER — VITAMIN D 1000 UNITS PO TABS
1000.0000 [IU] | ORAL_TABLET | Freq: Two times a day (BID) | ORAL | Status: DC
  Administered 2014-11-10: 1000 [IU] via ORAL
  Filled 2014-11-09 (×2): qty 1

## 2014-11-09 MED ORDER — VITAMIN D (ERGOCALCIFEROL) 1.25 MG (50000 UNIT) PO CAPS
50000.0000 [IU] | ORAL_CAPSULE | ORAL | Status: DC
  Filled 2014-11-09: qty 1

## 2014-11-09 NOTE — Clinical Social Work Note (Signed)
Clinical Social Worker continuing to follow patient and family for support and discharge planning needs.  [facility] for [facility].  Per chart, inpatient rehab assessing patient for possible admission if bed available - SNF bed available for back up if needed.  CSW remains available for support and to facilitate patient discharge needs once medically stable.  Macario GoldsJesse Emyah Roznowski, [place] [phone]

## 2014-11-09 NOTE — Progress Notes (Signed)
Pt refusing to take meds states he will take later,refusing to work with therapy
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
## 2014-11-09 NOTE — Progress Notes (Signed)
PT Cancellation Note  Patient Details Name: [name] MRN: [id number] DOB: 1980-11-17   Cancelled Treatment:    Reason Eval/Treat Not Completed: Fatigue/lethargy limiting ability to participate. Patient stating that he didn't sleep well last night ( and wanted to continue to sleep) and wanted to attempt therapy later this afternoon.    Fredrich BirksRobinette, Julia Elizabeth 11/09/2014, 11:13 AM

## 2014-11-09 NOTE — Progress Notes (Signed)
OT Cancellation Note  Patient Details Name: [name] MRN: [id number] DOB: 29-Dec-1980   Cancelled Treatment:    Reason Eval/Treat Not Completed: Fatigue/lethargy limiting ability to participate - Pt in bed with lights off.  Refuses to work with OT despite max encouragement citing fatigue.  Explained need to be OOB and to perform UE exercise as well as implications for not working with therapy and he continued to refuse.   Will reattempt.   Angelene GiovanniConarpe, Tenita Cue M  [place] Pherigo [place], OTR/L [phone]  11/09/2014, 12:21 PM

## 2014-11-09 NOTE — Progress Notes (Signed)
Rehab admissions - I met briefly with patient.  He is now agreeable to inpatient rehab admission.  Currently rehab beds are full.  I will follow up again in am for bed availability.  Call me for questions.  #[phone]
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
## 2014-11-10 LAB — COMPREHENSIVE METABOLIC PANEL
ALT: 13 U/L (ref 0–53)
AST: 20 U/L (ref 0–37)
Albumin: 2.3 g/dL — ABNORMAL LOW (ref 3.5–5.2)
Alkaline Phosphatase: 71 U/L (ref 39–117)
Anion gap: 9 (ref 5–15)
BUN: <5 mg/dL — ABNORMAL LOW (ref 6–23)
CO2: 31 mmol/L (ref 19–32)
Calcium: 8.6 mg/dL (ref 8.4–10.5)
Chloride: 101 mmol/L (ref 96–112)
Creatinine, Ser: 0.35 mg/dL — ABNORMAL LOW (ref 0.50–1.35)
GFR calc Af Amer: >90 mL/min (ref >90)
GFR calc non Af Amer: >90 mL/min (ref >90)
Glucose, Bld: 99 mg/dL (ref 70–99)
Potassium: 3.5 mmol/L (ref 3.5–5.1)
SODIUM: 141 mmol/L (ref 135–145)
TOTAL PROTEIN: 5.1 g/dL — AB (ref 6.0–8.3)
Total Bilirubin: 0.4 mg/dL (ref 0.3–1.2)

## 2014-11-10 LAB — VITAMIN D 1,25 DIHYDROXY
Vitamin D 1, 25 (OH)2 Total: 25 pg/mL
Vitamin D2 1, 25 (OH)2: <10 pg/mL
Vitamin D3 1, 25 (OH)2: 24 pg/mL

## 2014-11-10 LAB — CALCIUM, IONIZED: Calcium, Ion: 1.25 mmol/L — ABNORMAL HIGH (ref 1.12–1.32)

## 2014-11-10 LAB — TSH: TSH: 3.401 u[IU]/mL (ref 0.350–4.500)

## 2014-11-10 MED ORDER — ASCORBIC ACID 500 MG PO TABS: 500.0000 mg | ORAL_TABLET | Freq: Two times a day (BID) | ORAL | Status: DC

## 2014-11-10 MED ORDER — VITAMIN D (ERGOCALCIFEROL) 1.25 MG (50000 UNIT) PO CAPS: 50000.0000 [IU] | ORAL_CAPSULE | ORAL | Status: DC

## 2014-11-10 MED ORDER — TRAMADOL HCL 50 MG PO TABS: 50.0000 mg | ORAL_TABLET | Freq: Four times a day (QID) | ORAL | Status: DC | PRN

## 2014-11-10 MED ORDER — TAMSULOSIN HCL 0.4 MG PO CAPS: 0.4000 mg | ORAL_CAPSULE | Freq: Every day | ORAL | Status: AC

## 2014-11-10 MED ORDER — TRAMADOL HCL 50 MG PO TABS
50.0000 mg | ORAL_TABLET | Freq: Four times a day (QID) | ORAL | Status: DC | PRN
  Filled 2014-11-10: qty 2

## 2014-11-10 MED ORDER — CHOLECALCIFEROL 25 MCG (1000 UT) PO TABS: 1000.0000 [IU] | ORAL_TABLET | Freq: Two times a day (BID) | ORAL | Status: DC
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
## 2014-11-10 NOTE — Progress Notes (Signed)
Rehab admissions - Currently rehab beds are full.  Agree with pursuit of outside hospital rehab facility.  #[phone]
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
## 2014-11-10 NOTE — Progress Notes (Signed)
[facility]. Report called to facility and given to AtlasLatonnia, LPN.

## 2014-11-10 NOTE — Progress Notes (Signed)
UR completed.  Medicare IM (Important Message) delivered to patient today by me in anticipation of discharge.    Genette Huertas, RN BSN MHA CCM Trauma/Neuro ICU Case Manager [phone]  

## 2014-11-10 NOTE — Clinical Social Work Note (Signed)
Clinical Social Worker facilitated patient discharge including contacting patient family and facility to confirm patient discharge plans.  Clinical information faxed to facility and family agreeable with plan.  CSW arranged ambulance transport via [facility] to [facility].  RN to call report prior to discharge.  Clinical Social Worker will sign off for now as social work intervention is no longer needed. Please consult us again if new need arises.  Jesse Tametra Ahart, LCSW [phone] 

## 2014-11-11 LAB — PTH, INTACT AND CALCIUM
Calcium, Total (PTH): 8.5 mg/dL — ABNORMAL LOW (ref 8.7–10.2)
PTH: 34 pg/mL (ref 15–65)

## 2014-11-11 LAB — TESTOSTERONE, FREE: Testosterone, Free: 25.5 pg/mL — ABNORMAL LOW (ref 47.0–244.0)

## 2014-11-11 LAB — TESTOSTERONE: TESTOSTERONE: 327 ng/dL (ref 300–890)

## 2014-11-11 LAB — TRANSFERRIN: Transferrin: 111 mg/dL — ABNORMAL LOW (ref 200–370)

## 2014-11-11 LAB — PREALBUMIN: Prealbumin: 7 mg/dL — ABNORMAL LOW (ref 21–43)

## 2014-11-11 LAB — HEMOGLOBIN A1C
Hgb A1c MFr Bld: 5.4 % (ref 4.8–5.6)
Mean Plasma Glucose: 108 mg/dL

## 2014-11-11 LAB — TESTOSTERONE, % FREE: Testosterone-% Free: 0.8 % — ABNORMAL LOW (ref 1.6–2.9)

## 2014-11-11 LAB — SEX HORMONE BINDING GLOBULIN: Sex Hormone Binding: 114 nmol/L — ABNORMAL HIGH (ref 10–50)

## 2014-12-18 ENCOUNTER — Emergency Department (HOSPITAL_COMMUNITY): Payer: Medicare Other

## 2014-12-18 ENCOUNTER — Emergency Department (HOSPITAL_COMMUNITY)
Admission: EM | Admit: 2014-12-18 | Discharge: 2014-12-18 | Disposition: A | Discharge status: Home / Self Care | Payer: Medicare Other | Attending: Emergency Medicine | Admitting: Emergency Medicine

## 2014-12-18 ENCOUNTER — Encounter (HOSPITAL_COMMUNITY): Payer: Self-pay | Admitting: Emergency Medicine

## 2014-12-18 DIAGNOSIS — Z880 Allergy status to penicillin: Secondary | ICD-10-CM | POA: Diagnosis not present

## 2014-12-18 DIAGNOSIS — F99 Mental disorder, not otherwise specified: Secondary | ICD-10-CM | POA: Insufficient documentation

## 2014-12-18 DIAGNOSIS — F419 Anxiety disorder, unspecified: Secondary | ICD-10-CM | POA: Insufficient documentation

## 2014-12-18 DIAGNOSIS — Z79899 Other long term (current) drug therapy: Secondary | ICD-10-CM | POA: Insufficient documentation

## 2014-12-18 DIAGNOSIS — Z8719 Personal history of other diseases of the digestive system: Secondary | ICD-10-CM | POA: Diagnosis not present

## 2014-12-18 DIAGNOSIS — Z8701 Personal history of pneumonia (recurrent): Secondary | ICD-10-CM | POA: Insufficient documentation

## 2014-12-18 DIAGNOSIS — F329 Major depressive disorder, single episode, unspecified: Secondary | ICD-10-CM | POA: Insufficient documentation

## 2014-12-18 DIAGNOSIS — G40909 Epilepsy, unspecified, not intractable, without status epilepticus: Secondary | ICD-10-CM | POA: Diagnosis not present

## 2014-12-18 DIAGNOSIS — R569 Unspecified convulsions: Secondary | ICD-10-CM

## 2014-12-18 DIAGNOSIS — Z720 Tobacco use: Secondary | ICD-10-CM | POA: Diagnosis not present

## 2014-12-18 LAB — BASIC METABOLIC PANEL
Anion gap: 7 (ref 5–15)
BUN: 7 mg/dL (ref 6–20)
CALCIUM: 9.3 mg/dL (ref 8.9–10.3)
CO2: 27 mmol/L (ref 22–32)
CREATININE: 0.34 mg/dL — AB (ref 0.61–1.24)
Chloride: 105 mmol/L (ref 101–111)
GFR calc non Af Amer: >60 mL/min (ref >60)
GLUCOSE: 74 mg/dL (ref 65–99)
Potassium: 3.8 mmol/L (ref 3.5–5.1)
Sodium: 139 mmol/L (ref 135–145)

## 2014-12-18 LAB — URINALYSIS, ROUTINE W REFLEX MICROSCOPIC
Bilirubin Urine: NEGATIVE
Glucose, UA: NEGATIVE mg/dL
HGB URINE DIPSTICK: NEGATIVE
Ketones, ur: 15 mg/dL — AB
Leukocytes, UA: NEGATIVE
Nitrite: NEGATIVE
PROTEIN: 100 mg/dL — AB
Specific Gravity, Urine: 1.023 (ref 1.005–1.030)
UROBILINOGEN UA: 1 mg/dL (ref 0.0–1.0)
pH: 8 (ref 5.0–8.0)

## 2014-12-18 LAB — URINE MICROSCOPIC-ADD ON

## 2014-12-18 LAB — CBC WITH DIFFERENTIAL/PLATELET
BASOS ABS: 0 10*3/uL (ref 0.0–0.1)
Basophils Relative: 0 % (ref 0–1)
EOS ABS: 0.2 10*3/uL (ref 0.0–0.7)
EOS PCT: 2 % (ref 0–5)
HCT: 38.9 % — ABNORMAL LOW (ref 39.0–52.0)
HEMOGLOBIN: 13.5 g/dL (ref 13.0–17.0)
LYMPHS PCT: 40 % (ref 12–46)
Lymphs Abs: 3.8 10*3/uL (ref 0.7–4.0)
MCH: 32.2 pg (ref 26.0–34.0)
MCHC: 34.7 g/dL (ref 30.0–36.0)
MCV: 92.8 fL (ref 78.0–100.0)
Monocytes Absolute: 0.8 10*3/uL (ref 0.1–1.0)
Monocytes Relative: 8 % (ref 3–12)
NEUTROS ABS: 4.7 10*3/uL (ref 1.7–7.7)
Neutrophils Relative %: 50 % (ref 43–77)
Platelets: 338 10*3/uL (ref 150–400)
RBC: 4.19 MIL/uL — AB (ref 4.22–5.81)
RDW: 12.7 % (ref 11.5–15.5)
WBC: 9.5 10*3/uL (ref 4.0–10.5)

## 2014-12-18 NOTE — ED Notes (Signed)
Resident at bedside.  
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
## 2014-12-18 NOTE — ED Notes (Signed)
Phlebotomy attempted to draw cbc x3 without success. MD Tegeler notified.

## 2014-12-18 NOTE — Discharge Instructions (Signed)

## 2014-12-18 NOTE — ED Notes (Signed)
Pt awaiting [facility]; placed in hallway bed for pickup
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
## 2015-02-08 ENCOUNTER — Encounter: Payer: Self-pay | Admitting: Vascular Surgery

## 2015-02-09 ENCOUNTER — Other Ambulatory Visit: Payer: Self-pay | Admitting: Vascular Surgery

## 2015-02-09 DIAGNOSIS — S3500XD Unspecified injury of abdominal aorta, subsequent encounter: Secondary | ICD-10-CM

## 2015-02-10 ENCOUNTER — Ambulatory Visit: Payer: Medicare Other | Admitting: Vascular Surgery

## 2015-02-10 ENCOUNTER — Other Ambulatory Visit (HOSPITAL_COMMUNITY): Payer: Medicare Other

## 2015-03-14 ENCOUNTER — Emergency Department (HOSPITAL_COMMUNITY)
Admission: EM | Admit: 2015-03-14 | Discharge: 2015-03-14 | Disposition: A | Discharge status: Home / Self Care | Payer: Medicare Other | Attending: Emergency Medicine | Admitting: Emergency Medicine

## 2015-03-14 ENCOUNTER — Encounter (HOSPITAL_COMMUNITY): Payer: Self-pay | Admitting: *Deleted

## 2015-03-14 ENCOUNTER — Emergency Department (HOSPITAL_COMMUNITY): Payer: Medicare Other

## 2015-03-14 DIAGNOSIS — Z880 Allergy status to penicillin: Secondary | ICD-10-CM | POA: Diagnosis not present

## 2015-03-14 DIAGNOSIS — Y9389 Activity, other specified: Secondary | ICD-10-CM | POA: Insufficient documentation

## 2015-03-14 DIAGNOSIS — F329 Major depressive disorder, single episode, unspecified: Secondary | ICD-10-CM | POA: Insufficient documentation

## 2015-03-14 DIAGNOSIS — G40409 Other generalized epilepsy and epileptic syndromes, not intractable, without status epilepticus: Secondary | ICD-10-CM | POA: Insufficient documentation

## 2015-03-14 DIAGNOSIS — Z8719 Personal history of other diseases of the digestive system: Secondary | ICD-10-CM | POA: Diagnosis not present

## 2015-03-14 DIAGNOSIS — F419 Anxiety disorder, unspecified: Secondary | ICD-10-CM | POA: Insufficient documentation

## 2015-03-14 DIAGNOSIS — S82202A Unspecified fracture of shaft of left tibia, initial encounter for closed fracture: Secondary | ICD-10-CM

## 2015-03-14 DIAGNOSIS — S8265XA Nondisplaced fracture of lateral malleolus of left fibula, initial encounter for closed fracture: Secondary | ICD-10-CM | POA: Insufficient documentation

## 2015-03-14 DIAGNOSIS — Y998 Other external cause status: Secondary | ICD-10-CM | POA: Diagnosis not present

## 2015-03-14 DIAGNOSIS — Y92410 Unspecified street and highway as the place of occurrence of the external cause: Secondary | ICD-10-CM | POA: Diagnosis not present

## 2015-03-14 DIAGNOSIS — Q743 Arthrogryposis multiplex congenita: Secondary | ICD-10-CM | POA: Insufficient documentation

## 2015-03-14 DIAGNOSIS — Z8701 Personal history of pneumonia (recurrent): Secondary | ICD-10-CM | POA: Insufficient documentation

## 2015-03-14 DIAGNOSIS — Z720 Tobacco use: Secondary | ICD-10-CM | POA: Insufficient documentation

## 2015-03-14 DIAGNOSIS — Z79899 Other long term (current) drug therapy: Secondary | ICD-10-CM | POA: Diagnosis not present

## 2015-03-14 DIAGNOSIS — F99 Mental disorder, not otherwise specified: Secondary | ICD-10-CM | POA: Diagnosis not present

## 2015-03-14 DIAGNOSIS — S99912A Unspecified injury of left ankle, initial encounter: Secondary | ICD-10-CM | POA: Diagnosis present

## 2015-03-14 MED ORDER — HYDROCODONE-ACETAMINOPHEN 5-325 MG PO TABS: 2.0000 | ORAL_TABLET | ORAL | Status: DC | PRN

## 2015-03-14 MED ORDER — HYDROCODONE-ACETAMINOPHEN 5-325 MG PO TABS
2.0000 | ORAL_TABLET | Freq: Once | ORAL | Status: AC
  Administered 2015-03-14: 2 via ORAL
  Filled 2015-03-14: qty 2

## 2015-03-14 MED ORDER — OXYCODONE-ACETAMINOPHEN 5-325 MG PO TABS: 2.0000 | ORAL_TABLET | ORAL | Status: DC | PRN
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
## 2015-03-14 NOTE — Discharge Instructions (Signed)
Cast or Splint Care Casts and splints support injured limbs and keep bones from moving while they heal.  HOME CARE  Keep the cast or splint uncovered during the drying period.  A plaster cast can take 24 to 48 hours to dry.  A fiberglass cast will dry in less than 1 hour.  Do not rest the cast on anything harder than a pillow for 24 hours.  Do not put weight on your injured limb. Do not put pressure on the cast. Wait for your doctor's approval.  Keep the cast or splint dry.  Cover the cast or splint with a plastic bag during baths or wet weather.  If you have a cast over your chest and belly (trunk), take sponge baths until the cast is taken off.  If your cast gets wet, dry it with a towel or blow dryer. Use the cool setting on the blow dryer.  Keep your cast or splint clean. Wash a dirty cast with a damp cloth.  Do not put any objects under your cast or splint.  Do not scratch the skin under the cast with an object. If itching is a problem, use a blow dryer on a cool setting over the itchy area.  Do not trim or cut your cast.  Do not take out the padding from inside your cast.  Exercise your joints near the cast as told by your doctor.  Raise (elevate) your injured limb on 1 or 2 pillows for the first 1 to 3 days. GET HELP IF:  Your cast or splint cracks.  Your cast or splint is too tight or too loose.  You itch badly under the cast.  Your cast gets wet or has a soft spot.  You have a bad smell coming from the cast.  You get an object stuck under the cast.  Your skin around the cast becomes red or sore.  You have new or more pain after the cast is put on. GET HELP RIGHT AWAY IF:  You have fluid leaking through the cast.  You cannot move your fingers or toes.  Your fingers or toes turn blue or white or are cool, painful, or puffy (swollen).  You have tingling or lose feeling (numbness) around the injured area.  You have bad pain or pressure under the  cast.  You have trouble breathing or have shortness of breath.  You have chest pain. Document Released: 11/09/2010 Document Revised: 03/12/2013 Document Reviewed: 01/16/2013 [facility] Patient Information 2015 [place], [place]. This information is not intended to replace advice given to you by your health care provider. Make sure you discuss any questions you have with your health care provider. Ankle Fracture A fracture is a break in a bone. The ankle joint is made up of three bones. These include the lower (distal)sections of your lower leg bones, called the tibia and fibula, along with a bone in your foot, called the talus. Depending on how bad the break is and if more than one ankle joint bone is broken, a cast or splint is used to protect and keep your injured bone from moving while it heals. Sometimes, surgery is required to help the fracture heal properly.  There are two general types of fractures:  Stable fracture. This includes a single fracture line through one bone, with no injury to ankle ligaments. A fracture of the talus that does not have any displacement (movement of the bone on either side of the fracture line) is also stable.  Unstable  fracture. This includes more than one fracture line through one or more bones in the ankle joint. It also includes fractures that have displacement of the bone on either side of the fracture line. CAUSES  A direct blow to the ankle.   Quickly and severely twisting your ankle.  Trauma, such as a car accident or falling from a significant height. RISK FACTORS You may be at a higher risk of ankle fracture if:  You have certain medical conditions.  You are involved in high-impact sports.  You are involved in a high-impact car accident. SIGNS AND SYMPTOMS   Tender and swollen ankle.  Bruising around the injured ankle.  Pain on movement of the ankle.  Difficulty walking or putting weight on the ankle.  A cold foot below the site of the  ankle injury. This can occur if the blood vessels passing through your injured ankle were also damaged.  Numbness in the foot below the site of the ankle injury. DIAGNOSIS  An ankle fracture is usually diagnosed with a physical exam and X-rays. A CT scan may also be required for complex fractures. TREATMENT  Stable fractures are treated with a cast or splint and using crutches to avoid putting weight on your injured ankle. This is followed by an ankle strengthening program. Some patients require a special type of cast, depending on other medical problems they may have. Unstable fractures require surgery to ensure the bones heal properly. Your health care provider will tell you what type of fracture you have and the best treatment for your condition. HOME CARE INSTRUCTIONS   Review correct crutch use with your health care provider and use your crutches as directed. Safe use of crutches is extremely important. Misuse of crutches can cause you to fall or cause injury to nerves in your hands or armpits.  Do not put weight or pressure on the injured ankle until directed by your health care provider.  To lessen the swelling, keep the injured leg elevated while sitting or lying down.  Apply ice to the injured area:  Put ice in a plastic bag.  Place a towel between your cast and the bag.  Leave the ice on for 20 minutes, 2-3 times a day.  If you have a plaster or fiberglass cast:  Do not try to scratch the skin under the cast with any objects. This can increase your risk of skin infection.  Check the skin around the cast every day. You may put lotion on any red or sore areas.  Keep your cast dry and clean.  If you have a plaster splint:  Wear the splint as directed.  You may loosen the elastic around the splint if your toes become numb, tingle, or turn cold or blue.  Do not put pressure on any part of your cast or splint; it may break. Rest your cast only on a pillow the first 24 hours  until it is fully hardened.  Your cast or splint can be protected during bathing with a plastic bag sealed to your skin with medical tape. Do not lower the cast or splint into water.  Take medicines as directed by your health care provider. Only take over-the-counter or prescription medicines for pain, discomfort, or fever as directed by your health care provider.  Do not drive a vehicle until your health care provider specifically tells you it is safe to do so.  If your health care provider has given you a follow-up appointment, it is very important to  keep that appointment. Not keeping the appointment could result in a chronic or permanent injury, pain, and disability. If you have any problem keeping the appointment, call the facility for assistance. SEEK MEDICAL CARE IF: You develop increased swelling or discomfort. SEEK IMMEDIATE MEDICAL CARE IF:   Your cast gets damaged or breaks.  You have continued severe pain.  You develop new pain or swelling after the cast was put on.  Your skin or toenails below the injury turn blue or gray.  Your skin or toenails below the injury feel cold, numb, or have loss of sensitivity to touch.  There is a bad smell or pus draining from under the cast. MAKE SURE YOU:   Understand these instructions.  Will watch your condition.  Will get help right away if you are not doing well or get worse. Document Released: 07/07/2000 Document Revised: 07/15/2013 Document Reviewed: 02/06/2013 [facility] Patient Information 2015 [place], [place]. This information is not intended to replace advice given to you by your health care provider. Make sure you discuss any questions you have with your health care provider.
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
## 2015-03-20 ENCOUNTER — Encounter (HOSPITAL_COMMUNITY): Payer: Self-pay | Admitting: Nurse Practitioner

## 2015-03-20 ENCOUNTER — Emergency Department (HOSPITAL_COMMUNITY)
Admission: EM | Admit: 2015-03-20 | Discharge: 2015-03-20 | Disposition: A | Discharge status: Home / Self Care | Payer: Medicare Other | Attending: Emergency Medicine | Admitting: Emergency Medicine

## 2015-03-20 DIAGNOSIS — S99922D Unspecified injury of left foot, subsequent encounter: Secondary | ICD-10-CM | POA: Diagnosis present

## 2015-03-20 DIAGNOSIS — F99 Mental disorder, not otherwise specified: Secondary | ICD-10-CM | POA: Insufficient documentation

## 2015-03-20 DIAGNOSIS — Z8719 Personal history of other diseases of the digestive system: Secondary | ICD-10-CM | POA: Diagnosis not present

## 2015-03-20 DIAGNOSIS — Z720 Tobacco use: Secondary | ICD-10-CM | POA: Diagnosis not present

## 2015-03-20 DIAGNOSIS — F419 Anxiety disorder, unspecified: Secondary | ICD-10-CM | POA: Diagnosis not present

## 2015-03-20 DIAGNOSIS — Z880 Allergy status to penicillin: Secondary | ICD-10-CM | POA: Diagnosis not present

## 2015-03-20 DIAGNOSIS — Z09 Encounter for follow-up examination after completed treatment for conditions other than malignant neoplasm: Secondary | ICD-10-CM | POA: Diagnosis not present

## 2015-03-20 DIAGNOSIS — Q743 Arthrogryposis multiplex congenita: Secondary | ICD-10-CM | POA: Insufficient documentation

## 2015-03-20 DIAGNOSIS — Z8669 Personal history of other diseases of the nervous system and sense organs: Secondary | ICD-10-CM | POA: Diagnosis not present

## 2015-03-20 DIAGNOSIS — Z8701 Personal history of pneumonia (recurrent): Secondary | ICD-10-CM | POA: Insufficient documentation

## 2015-03-20 DIAGNOSIS — F329 Major depressive disorder, single episode, unspecified: Secondary | ICD-10-CM | POA: Diagnosis not present

## 2015-03-20 DIAGNOSIS — W231XXD Caught, crushed, jammed, or pinched between stationary objects, subsequent encounter: Secondary | ICD-10-CM | POA: Diagnosis not present

## 2015-03-20 MED ORDER — OXYCODONE-ACETAMINOPHEN 5-325 MG PO TABS: 1.0000 | ORAL_TABLET | ORAL | Status: DC | PRN

## 2015-03-20 MED ORDER — HYDROCODONE-ACETAMINOPHEN 5-325 MG PO TABS
1.0000 | ORAL_TABLET | Freq: Once | ORAL | Status: AC
  Administered 2015-03-20: 1 via ORAL
  Filled 2015-03-20: qty 1

## 2015-03-20 MED ORDER — OXYCODONE-ACETAMINOPHEN 5-325 MG PO TABS: 1.0000 | ORAL_TABLET | ORAL | Status: AC | PRN
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
## 2015-03-20 NOTE — Progress Notes (Signed)
Orthopedic Tech Progress Note Patient Details:  [name] [birth year] [id number]  Ortho Devices Type of Ortho Device: Ace wrap, Post (short leg) splint Ortho Device/Splint Interventions: Application   Cammer, Mickie Bail 03/20/2015, 5:42 PM
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
## 2015-03-20 NOTE — ED Notes (Signed)
Declined W/C at D/C and was escorted to lobby by RN. 
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
